# Patient Record
Sex: Female | Born: 1950
Health system: Southern US, Community
[De-identification: ages and names within clinical notes are randomized; demographics above are authoritative.]

## PROBLEM LIST (undated history)

## (undated) DIAGNOSIS — M1990 Unspecified osteoarthritis, unspecified site: Secondary | ICD-10-CM

## (undated) DIAGNOSIS — T7840XA Allergy, unspecified, initial encounter: Secondary | ICD-10-CM

## (undated) DIAGNOSIS — L309 Dermatitis, unspecified: Secondary | ICD-10-CM

## (undated) DIAGNOSIS — K219 Gastro-esophageal reflux disease without esophagitis: Secondary | ICD-10-CM

## (undated) DIAGNOSIS — N390 Urinary tract infection, site not specified: Secondary | ICD-10-CM

## (undated) DIAGNOSIS — K5792 Diverticulitis of intestine, part unspecified, without perforation or abscess without bleeding: Secondary | ICD-10-CM

## (undated) DIAGNOSIS — N83209 Unspecified ovarian cyst, unspecified side: Secondary | ICD-10-CM

## (undated) DIAGNOSIS — L409 Psoriasis, unspecified: Secondary | ICD-10-CM

## (undated) DIAGNOSIS — M5104 Intervertebral disc disorders with myelopathy, thoracic region: Secondary | ICD-10-CM

## (undated) HISTORY — PX: OOPHORECTOMY: SHX86

## (undated) HISTORY — PX: COLONOSCOPY: SHX174

## (undated) HISTORY — DX: Allergy, unspecified, initial encounter: T78.40XA

## (undated) HISTORY — PX: REFRACTIVE SURGERY: SHX103

## (undated) HISTORY — PX: TUBAL LIGATION: SHX77

## (undated) HISTORY — DX: Diverticulitis of intestine, part unspecified, without perforation or abscess without bleeding: K57.92

## (undated) HISTORY — DX: Psoriasis, unspecified: L40.9

## (undated) HISTORY — DX: Unspecified ovarian cyst, unspecified side: N83.209

## (undated) HISTORY — DX: Dermatitis, unspecified: L30.9

## (undated) HISTORY — PX: EYE SURGERY: SHX253

---

## 2003-05-19 ENCOUNTER — Other Ambulatory Visit: Admission: RE | Admit: 2003-05-19 | Discharge: 2003-05-19 | Payer: Self-pay | Admitting: Family Medicine

## 2004-06-06 ENCOUNTER — Encounter: Payer: Self-pay | Admitting: Family Medicine

## 2004-06-06 ENCOUNTER — Other Ambulatory Visit: Admission: RE | Admit: 2004-06-06 | Discharge: 2004-06-06 | Payer: Self-pay | Admitting: Family Medicine

## 2004-06-06 LAB — CONVERTED CEMR LAB: Pap Smear: NORMAL

## 2004-08-31 ENCOUNTER — Ambulatory Visit: Payer: Self-pay | Admitting: Family Medicine

## 2004-11-06 ENCOUNTER — Ambulatory Visit: Payer: Self-pay | Admitting: Family Medicine

## 2005-05-01 ENCOUNTER — Ambulatory Visit: Payer: Self-pay | Admitting: Family Medicine

## 2005-05-17 ENCOUNTER — Ambulatory Visit: Payer: Self-pay | Admitting: Family Medicine

## 2005-08-17 ENCOUNTER — Ambulatory Visit: Payer: Self-pay | Admitting: Family Medicine

## 2005-10-03 ENCOUNTER — Ambulatory Visit: Payer: Self-pay | Admitting: Family Medicine

## 2005-10-30 ENCOUNTER — Ambulatory Visit: Payer: Self-pay | Admitting: Unknown Physician Specialty

## 2006-03-06 ENCOUNTER — Ambulatory Visit: Payer: Self-pay | Admitting: Family Medicine

## 2006-06-26 ENCOUNTER — Ambulatory Visit: Payer: Self-pay | Admitting: Family Medicine

## 2007-01-18 ENCOUNTER — Emergency Department (HOSPITAL_COMMUNITY): Admission: EM | Admit: 2007-01-18 | Discharge: 2007-01-18 | Payer: Self-pay | Admitting: Family Medicine

## 2007-04-01 ENCOUNTER — Ambulatory Visit: Payer: Self-pay | Admitting: Unknown Physician Specialty

## 2007-06-18 ENCOUNTER — Encounter: Payer: Self-pay | Admitting: Family Medicine

## 2007-06-18 DIAGNOSIS — J309 Allergic rhinitis, unspecified: Secondary | ICD-10-CM | POA: Insufficient documentation

## 2007-06-18 DIAGNOSIS — Z8719 Personal history of other diseases of the digestive system: Secondary | ICD-10-CM | POA: Insufficient documentation

## 2007-06-18 DIAGNOSIS — N83209 Unspecified ovarian cyst, unspecified side: Secondary | ICD-10-CM | POA: Insufficient documentation

## 2007-07-10 ENCOUNTER — Ambulatory Visit: Payer: Self-pay | Admitting: Family Medicine

## 2007-07-11 LAB — CONVERTED CEMR LAB
ALT: 25 units/L (ref 0–35)
Basophils Relative: 0.1 % (ref 0.0–1.0)
Bilirubin, Direct: 0.1 mg/dL (ref 0.0–0.3)
CO2: 29 meq/L (ref 19–32)
Direct LDL: 159.7 mg/dL
Eosinophils Absolute: 0.3 10*3/uL (ref 0.0–0.6)
Eosinophils Relative: 5 % (ref 0.0–5.0)
GFR calc Af Amer: 96 mL/min
GFR calc non Af Amer: 79 mL/min
Glucose, Bld: 93 mg/dL (ref 70–99)
HDL: 41.2 mg/dL (ref >39.0)
Hemoglobin: 13.2 g/dL (ref 12.0–15.0)
Lymphocytes Relative: 39.2 % (ref 12.0–46.0)
MCV: 91.6 fL (ref 78.0–100.0)
Monocytes Absolute: 0.6 10*3/uL (ref 0.2–0.7)
Neutro Abs: 2.6 10*3/uL (ref 1.4–7.7)
Neutrophils Relative %: 44.8 % (ref 43.0–77.0)
Platelets: 306 10*3/uL (ref 150–400)
Potassium: 4.1 meq/L (ref 3.5–5.1)
Sodium: 140 meq/L (ref 135–145)
Total Protein: 6.8 g/dL (ref 6.0–8.3)
VLDL: 16 mg/dL (ref 0–40)
WBC: 5.7 10*3/uL (ref 4.5–10.5)

## 2007-07-31 ENCOUNTER — Ambulatory Visit: Payer: Self-pay | Admitting: Family Medicine

## 2007-08-01 ENCOUNTER — Encounter (INDEPENDENT_AMBULATORY_CARE_PROVIDER_SITE_OTHER): Payer: Self-pay | Admitting: *Deleted

## 2007-08-01 LAB — FECAL OCCULT BLOOD, GUAIAC: Fecal Occult Blood: NEGATIVE

## 2007-11-06 ENCOUNTER — Telehealth: Payer: Self-pay | Admitting: Family Medicine

## 2008-01-07 ENCOUNTER — Ambulatory Visit: Payer: Self-pay | Admitting: Family Medicine

## 2008-01-07 DIAGNOSIS — M549 Dorsalgia, unspecified: Secondary | ICD-10-CM | POA: Insufficient documentation

## 2008-01-07 DIAGNOSIS — M722 Plantar fascial fibromatosis: Secondary | ICD-10-CM | POA: Insufficient documentation

## 2008-02-03 ENCOUNTER — Encounter: Payer: Self-pay | Admitting: Family Medicine

## 2008-03-04 ENCOUNTER — Encounter: Payer: Self-pay | Admitting: Family Medicine

## 2008-04-01 ENCOUNTER — Encounter: Payer: Self-pay | Admitting: Family Medicine

## 2008-04-20 ENCOUNTER — Encounter (INDEPENDENT_AMBULATORY_CARE_PROVIDER_SITE_OTHER): Payer: Self-pay | Admitting: Internal Medicine

## 2008-04-20 ENCOUNTER — Ambulatory Visit: Payer: Self-pay | Admitting: Family Medicine

## 2008-06-16 ENCOUNTER — Ambulatory Visit: Payer: Self-pay | Admitting: Unknown Physician Specialty

## 2008-07-22 ENCOUNTER — Ambulatory Visit: Payer: Self-pay | Admitting: Family Medicine

## 2008-07-22 ENCOUNTER — Encounter (INDEPENDENT_AMBULATORY_CARE_PROVIDER_SITE_OTHER): Payer: Self-pay | Admitting: Internal Medicine

## 2008-07-22 DIAGNOSIS — N390 Urinary tract infection, site not specified: Secondary | ICD-10-CM | POA: Insufficient documentation

## 2008-07-22 LAB — CONVERTED CEMR LAB
Bilirubin Urine: NEGATIVE
Glucose, Urine, Semiquant: NEGATIVE
Glucose, Urine, Semiquant: NEGATIVE
Nitrite: NEGATIVE
Nitrite: NEGATIVE
Protein, U semiquant: NEGATIVE
Protein, U semiquant: NEGATIVE
RBC / HPF: 0
Specific Gravity, Urine: <1.005
Urobilinogen, UA: 0.2
Urobilinogen, UA: NEGATIVE
pH: 7

## 2008-07-30 ENCOUNTER — Telehealth (INDEPENDENT_AMBULATORY_CARE_PROVIDER_SITE_OTHER): Payer: Self-pay | Admitting: Internal Medicine

## 2008-12-03 ENCOUNTER — Telehealth (INDEPENDENT_AMBULATORY_CARE_PROVIDER_SITE_OTHER): Payer: Self-pay | Admitting: Internal Medicine

## 2008-12-03 ENCOUNTER — Encounter: Admission: RE | Admit: 2008-12-03 | Discharge: 2008-12-03 | Payer: Self-pay | Admitting: Family Medicine

## 2008-12-03 ENCOUNTER — Ambulatory Visit: Payer: Self-pay | Admitting: Family Medicine

## 2008-12-03 DIAGNOSIS — R1011 Right upper quadrant pain: Secondary | ICD-10-CM | POA: Insufficient documentation

## 2008-12-06 ENCOUNTER — Ambulatory Visit: Payer: Self-pay | Admitting: Family Medicine

## 2008-12-07 ENCOUNTER — Ambulatory Visit: Payer: Self-pay | Admitting: Cardiology

## 2008-12-08 LAB — CONVERTED CEMR LAB
BUN: 10 mg/dL (ref 6–23)
Calcium: 9.2 mg/dL (ref 8.4–10.5)
Creatinine, Ser: 0.7 mg/dL (ref 0.4–1.2)
GFR calc Af Amer: 112 mL/min
GFR calc non Af Amer: 92 mL/min

## 2008-12-09 ENCOUNTER — Encounter (INDEPENDENT_AMBULATORY_CARE_PROVIDER_SITE_OTHER): Payer: Self-pay | Admitting: Internal Medicine

## 2009-10-21 ENCOUNTER — Ambulatory Visit: Payer: Self-pay | Admitting: Family Medicine

## 2009-10-21 DIAGNOSIS — B009 Herpesviral infection, unspecified: Secondary | ICD-10-CM | POA: Insufficient documentation

## 2009-12-14 ENCOUNTER — Ambulatory Visit: Payer: Self-pay | Admitting: Unknown Physician Specialty

## 2010-04-06 ENCOUNTER — Encounter: Payer: Self-pay | Admitting: Family Medicine

## 2010-05-08 ENCOUNTER — Telehealth: Payer: Self-pay | Admitting: Internal Medicine

## 2010-08-31 ENCOUNTER — Ambulatory Visit: Payer: Self-pay | Admitting: Internal Medicine

## 2010-08-31 DIAGNOSIS — J0120 Acute ethmoidal sinusitis, unspecified: Secondary | ICD-10-CM | POA: Insufficient documentation

## 2010-08-31 DIAGNOSIS — R350 Frequency of micturition: Secondary | ICD-10-CM | POA: Insufficient documentation

## 2010-08-31 LAB — CONVERTED CEMR LAB
Bacteria, UA: 0
Casts: 0 /lpf
Nitrite: NEGATIVE
Specific Gravity, Urine: 1.01
Urine crystals, microscopic: 0 /hpf
Yeast, UA: 0

## 2010-11-23 ENCOUNTER — Telehealth: Payer: Self-pay | Admitting: Family Medicine

## 2010-12-06 ENCOUNTER — Telehealth: Payer: Self-pay | Admitting: Family Medicine

## 2010-12-10 ENCOUNTER — Encounter: Payer: Self-pay | Admitting: Family Medicine

## 2010-12-19 NOTE — Progress Notes (Signed)
Summary: Anucort HC  Phone Note Refill Request Message from:  Fax from Pharmacy on May 08, 2010 11:54 AM  Refills Requested: Medication #1:  ANUSOL-HC 25 MG SUPP 1 suppository per rectum up to three times a day as needed hemorroid  do not use for more than 3 days in a row. [place]  Phone:   [phone]   Method Requested: Electronic Initial call taken by: Delilah Shan CMA Duncan Dull),  May 08, 2010 11:54 AM  Follow-up for Phone Call        okay to fill #24 x 0 Follow-up by: Cindee Salt MD,  May 08, 2010 1:17 PM  Additional Follow-up for Phone Call Additional follow up Details #1::        Rx faxed to pharmacy Additional Follow-up by: DeShannon Smith CMA Duncan Dull),  May 08, 2010 3:15 PM    Prescriptions: ANUSOL-HC 25 MG SUPP (HYDROCORTISONE ACETATE) 1 suppository per rectum up to three times a day as needed hemorroid  do not use for more than 3 days in a row  #24 x 0   Entered by:   Mervin Hack CMA (AAMA)   Authorized by:   Cindee Salt MD   Signed by:   Mervin Hack CMA (AAMA) on 05/08/2010   Method used:   Electronically to        [facility]* (retail)       [address]       [place], [place]  [address]       Ph: [phone]       Fax: [phone]   RxID:   [id number]

## 2010-12-19 NOTE — Letter (Signed)
Summary: [facility]  [facility]   Imported By: Lanelle Bal 05/19/2010 12:33:37  _____________________________________________________________________  External Attachment:    Type:   Image     Comment:   External Document

## 2010-12-19 NOTE — Assessment & Plan Note (Signed)
Summary: ? VIRAL SYMPTOMS   Vital Signs:  Patient profile:   60 year old female Height:      63.5 inches Weight:      174.25 pounds BMI:     30.49 Temp:     99.6 degrees F oral Pulse rate:   84 / minute Pulse rhythm:   regular BP sitting:   146 / 82  (left arm) Cuff size:   large  Vitals Entered By: Selena Batten Dance CMA Duncan Dull) (August 31, 2010 3:33 PM) CC: Sick x4 weeks   History of Present Illness: CC: sick x 4 wks?  Feeling feverish x 4 wks.  Last night got real bad.  Chills and fever x 3 hours.  Also more congested today.  + thick yellow mucous from nose.  Congested in chest.  Started upper respiratory.  Got better then worse.  Today sore throat.  ? dysuria, + urgency and frequency since yesterday.  progressively worse.  + sinus congestion.  worse with bending head forward.  Tried mucinex DM didn't help.  takes daily allergy pill.  No rashes, abd pain, n/v/d.  no coughing.  No ear pain or tooth pain.  No HA  husband with bacterial infection on cipro a while back.  no smokers at home.  Current Medications (verified): 1)  Calcium 600 600 Mg  Tabs (Calcium Carbonate) .... Take By Mouth As Directed 2)  Vitamin D 400 Unit  Tabs (Cholecalciferol) .... Take By Mouth As Directed 3)  Glucosamine Chondroitin Complx   Tabs (Glucosamine-Chondroit-Vit C-Mn) .... Take By Mouth As Directed 4)  Valtrex 500 Mg  Tabs (Valacyclovir Hcl) .... Take 2 Tabs By Mouth At Onset of Cold Sore Then 2 Tabs 12 Hours Later 5)  Daily Vitamin Formula   Tabs (Multiple Vitamin) .... Take 1 Tablet By Mouth Once A Day 6)  Folic Acid 400 Mcg Tabs (Folic Acid) .... As Needed Over The Counter 7)  Align  Caps (Misc Intestinal Flora Regulat) .... As Needed Over The Counter 8)  Anusol-Hc 25 Mg Supp (Hydrocortisone Acetate) .Marland Kitchen.. 1 Suppository Per Rectum Up To Three Times A Day As Needed Hemorroid  Do Not Use For More Than 3 Days in A Row 9)  Premarin 0.625 Mg/gm Crea (Estrogens, Conjugated) .... As Directed  Allergies: 1)   Tetracycline  Past History:  Past Medical History: Last updated: 06/18/2007 Allergic rhinitis Diverticulitis, hx of  Social History: Last updated: 06/18/2007 Marital Status: Married Children: 3 Occupation: [employer]  Review of Systems       per HPI  Physical Exam  General:  overweight, tired appearing Head:  normocephalic, atraumatic, and no abnormalities observed.  + ethmoid sinus tenderness on right Eyes:  No corneal or conjunctival inflammation noted. EOMI. Perrla.  Ears:  External ear exam shows no significant lesions or deformities.  Otoscopic examination reveals clear canals, tympanic membranes are intact bilaterally without bulging, retraction, inflammation or discharge. Hearing is grossly normal bilaterally. Nose:  External nasal examination shows no deformity or inflammation. Nasal mucosa are pink and moist without lesions or exudates. Mouth:  mild pharyngeal erythema Neck:  no LAD, + tender R throat to palpation Lungs:  Normal respiratory effort, chest expands symmetrically. Lungs are clear to auscultation, no crackles or wheezes. Heart:  normal rate, regular rhythm, and no murmur.   Abdomen:  suprapubic pressure. Pulses:  2+ rad pulses Extremities:  no edema Skin:  no rash   Impression & Recommendations:  Problem # 1:  ACUTE ETHMOIDAL SINUSITIS (ICD-461.2) Initially wanted  to treat with amoxicillin, pt says amoxicillin does not help her and augmentin causes stomach upset.  requests cipro.  filled x 10 day course.  Call if symptoms persist or worsen.   sent in flonase as well.  The following medications were removed from the medication list:    Cipro 500 Mg Tabs (Ciprofloxacin hcl) .Marland Kitchen... 1 by mouth two times a day for 10 days    Flagyl 500 Mg Tabs (Metronidazole) .Marland Kitchen... 1 by mouth three times a day for 10 days Her updated medication list for this problem includes:    Ciprofloxacin Hcl 500 Mg Tabs (Ciprofloxacin hcl) .Marland Kitchen... Take one by mouth two times a day x 10  days    Flonase 50 Mcg/act Susp (Fluticasone propionate) .Marland Kitchen... 2 squirts in each nostril two times a day  Problem # 2:  URINARY FREQUENCY (ICD-788.41) UA micro not impressive for infection.  push fluids, return if not improved. The following medications were removed from the medication list:    Pyridium 100 Mg Tabs (Phenazopyridine hcl) .Marland Kitchen... 1-2 by mouth up to three times a day as needed urinary pain  Orders: UA Dipstick W/ Micro (manual) (21308)  Complete Medication List: 1)  Calcium 600 600 Mg Tabs (Calcium carbonate) .... Take by mouth as directed 2)  Vitamin D 400 Unit Tabs (Cholecalciferol) .... Take by mouth as directed 3)  Glucosamine Chondroitin Complx Tabs (Glucosamine-chondroit-vit c-mn) .... Take by mouth as directed 4)  Valtrex 500 Mg Tabs (Valacyclovir hcl) .... Take 2 tabs by mouth at onset of cold sore then 2 tabs 12 hours later 5)  Daily Vitamin Formula Tabs (Multiple vitamin) .... Take 1 tablet by mouth once a day 6)  Folic Acid 400 Mcg Tabs (Folic acid) .... As needed over the counter 7)  Align Caps (Misc intestinal flora regulat) .... As needed over the counter 8)  Anusol-hc 25 Mg Supp (Hydrocortisone acetate) .Marland Kitchen.. 1 suppository per rectum up to three times a day as needed hemorroid  do not use for more than 3 days in a row 9)  Premarin 0.625 Mg/gm Crea (Estrogens, conjugated) .... As directed 10)  Ciprofloxacin Hcl 500 Mg Tabs (Ciprofloxacin hcl) .... Take one by mouth two times a day x 10 days 11)  Flonase 50 Mcg/act Susp (Fluticasone propionate) .... 2 squirts in each nostril two times a day  Patient Instructions: 1)  For sinuses, start flonase - nasal steroid, and nasal saline spray over the counter.  Drink plenty of fluids to help get mucous out. 2)  Antibiotic twice daily for 10 days.  Return if fevers continue, or if trouble breathing, swallowing, or other concern. 3)  urine doesn't look like infection.  push fluids, if not improving, return to be seen. 4)  Call  clinic with questions or if not improving as expected Prescriptions: CIPROFLOXACIN HCL 500 MG TABS (CIPROFLOXACIN HCL) take one by mouth two times a day x 10 days  #20 x 0   Entered and Authorized by:   Eustaquio Boyden  MD   Signed by:   Eustaquio Boyden  MD on 08/31/2010   Method used:   Electronically to        [facility]* (retail)       [address]       [place], [place]  [address]       Ph: [phone]       Fax: [phone]   RxID:   [id number] FLONASE 50 MCG/ACT SUSP (FLUTICASONE PROPIONATE) 2 squirts in each nostril two times  a day  #1 x 3   Entered and Authorized by:   Eustaquio Boyden  MD   Signed by:   Eustaquio Boyden  MD on 08/31/2010   Method used:   Electronically to        [facility]* (retail)       [address]       [place], [place]  [address]       Ph: [phone]       Fax: [phone]   RxID:   [id number] AMOXICILLIN 875 MG TABS (AMOXICILLIN) take one by mouth two times a day x 10 days  #20 x 0   Entered and Authorized by:   Eustaquio Boyden  MD   Signed by:   Eustaquio Boyden  MD on 08/31/2010   Method used:   Electronically to        [facility]* (retail)       [address]       [place], [place]  [address]       Ph: [phone]       Fax: [phone]   RxID:   [id number]   Current Allergies (reviewed today): TETRACYCLINE  Laboratory Results   Urine Tests  Date/Time Received: August 31, 2010 4:02 PM  Date/Time Reported: August 31, 2010 4:02 PM   Routine Urinalysis   Color: straw Appearance: Clear Glucose: negative   (Normal Range: Negative) Bilirubin: negative   (Normal Range: Negative) Ketone: negative   (Normal Range: Negative) Spec. Gravity: 1.010   (Normal Range: 1.003-1.035) Blood: small   (Normal Range: Negative) pH: 5.0   (Normal Range: 5.0-8.0) Protein: negative   (Normal Range: Negative) Urobilinogen: 0.2   (Normal Range: 0-1) Nitrite: negative   (Normal Range: Negative) Leukocyte Esterace: small    (Normal Range: Negative)  Urine Microscopic WBC/HPF: rare RBC/HPF: 0-3 Bacteria/HPF: 0 Mucous/HPF: 0 Epithelial/HPF: rare Crystals/HPF: 0 Casts/LPF: 0 Yeast/HPF: 0    Comments: read by ...............Eustaquio Boyden  MD  August 31, 2010 4:15 PM

## 2010-12-21 NOTE — Progress Notes (Signed)
Summary: patient going to [facility]  Phone Note Call from Patient Call back at Home Phone [phone]   Caller: Patient Call For: Judith Part MD Summary of Call: Patient walked in this morning wanting appt., she says that she has a uti, and has notice blood in her urine this morning. There are no appt available until 3:30 today and she didn't want to wait until then. I suggested the [facility] on [address]. Patient was agreed to that.  Initial call taken by: Melody Comas,  December 06, 2010 8:35 AM  Follow-up for Phone Call        agree with above  Follow-up by: Judith Part MD,  December 06, 2010 10:13 AM

## 2010-12-21 NOTE — Progress Notes (Signed)
Summary: anusol   Phone Note Refill Request Message from:  Fax from Pharmacy on November 23, 2010 4:18 PM  Refills Requested: Medication #1:  ANUSOL-HC 25 MG SUPP 1 suppository per rectum up to three times a day as needed hemorroid  do not use for more than 3 days in a row   Last Refilled: 05/08/2010 Refill request from [id number].   Initial call taken by: Melody Comas,  November 23, 2010 4:19 PM  Follow-up for Phone Call        px written on EMR for call in  Follow-up by: Judith Part MD,  November 23, 2010 4:46 PM  Additional Follow-up for Phone Call Additional follow up Details #1::        med sent electronically to [facility] pharmacy.Lewanda Rife LPN  November 23, 2010 5:06 PM     Prescriptions: ANUSOL-HC 25 MG SUPP (HYDROCORTISONE ACETATE) 1 suppository per rectum up to three times a day as needed hemorroid  do not use for more than 3 days in a row  #24 x 0   Entered by:   Lewanda Rife LPN   Authorized by:   Judith Part MD   Signed by:   Lewanda Rife LPN on 45/40/9811   Method used:   Electronically to        [facility]* (retail)       [address]       [place], [place]  [address]       Ph: [phone]       Fax: [phone]   RxID:   [id number]

## 2011-03-01 ENCOUNTER — Other Ambulatory Visit: Payer: Self-pay | Admitting: *Deleted

## 2011-03-02 MED ORDER — HYDROCORTISONE ACETATE 25 MG RE SUPP: RECTAL | Status: DC

## 2011-03-02 MED ORDER — VALACYCLOVIR HCL 500 MG PO TABS: ORAL_TABLET | ORAL | Status: DC

## 2011-03-02 NOTE — Telephone Encounter (Signed)
Medication phoned to  [facility] pharmacy as instructed. Patient notified as instructed by telephone.

## 2011-03-02 NOTE — Telephone Encounter (Signed)
Px written for call in   

## 2011-04-05 ENCOUNTER — Ambulatory Visit: Payer: Self-pay | Admitting: Unknown Physician Specialty

## 2011-10-12 ENCOUNTER — Other Ambulatory Visit: Payer: Self-pay | Admitting: *Deleted

## 2011-10-12 MED ORDER — HYDROCORTISONE ACETATE 25 MG RE SUPP: RECTAL | Status: DC

## 2011-10-12 NOTE — Telephone Encounter (Signed)
Will refill electronically  

## 2011-10-12 NOTE — Telephone Encounter (Signed)
Faxed request from [facility], last filled 03/02/11.

## 2011-11-13 ENCOUNTER — Emergency Department: Payer: Self-pay | Admitting: Emergency Medicine

## 2011-11-14 ENCOUNTER — Telehealth: Payer: Self-pay | Admitting: Family Medicine

## 2011-11-14 NOTE — Telephone Encounter (Signed)
Triage Record Num: [id number] Operator: Caswell Corwin Patient Name: Katelyn Rivera Call Date & Time: 11/12/2011 8:53:49PM Patient Phone: [phone] PCP: Audrie Gallus. Tower Patient Gender: Female PCP Fax : Patient DOB: [birth year] Practice Name: [facility] Reason for Call: Caller: Olena/Patient; PCP: Roxy Manns A.; CB#: [phone]; Call regarding Diverticulitis that started today. Has pain when she moves. She thinks it is stress induced. Triaged Abd Painand pt took Tramadol, but it has not helped. Triaged Abd Pain and pt cannot eat, or do her usual activities. Inst needs to go to the E/R for eval. Wants Dr. called for Cipro and Flagyl anbx. Paged Dr. Kerin Perna and inst pt will have to be evaluated. Pt recalled and inst given. Protocol(s) Used: Abdominal Pain Recommended Outcome per Protocol: See ED Immediately Reason for Outcome: Generalized abdominal pain that progresses to localized pain AND any of the following: loss of appetite, vomiting starting after pain, any fever, OR unable to carry out normal activities Care Advice: ~ 12/

## 2011-11-16 ENCOUNTER — Telehealth: Payer: Self-pay | Admitting: Internal Medicine

## 2011-11-16 ENCOUNTER — Telehealth: Payer: Self-pay | Admitting: Family Medicine

## 2011-11-16 NOTE — Telephone Encounter (Signed)
error 

## 2011-11-16 NOTE — Telephone Encounter (Signed)
Pt. called our call service on Dec 24 after 6pm and they advised her per their protocol to see ER and they also called Dr. Patsy Lager for advise on meds because she wanted medications instead of going to ER. Dr. Patsy Lager advised same and patient ended up going to ER and is now upset because she has an ER visit bill and was not prescribed meds from calling call service. I checked with Dr. Milinda Antis and she said she would have advised the same given the situation and we do not prescribe antibiotics over the phone and someone needed to evaluate the patient which is what was advised when patient called in.  Patient stated she would not return to our office unless we paid her ER bill. Dr. Milinda Antis advised that I speak with risk management prior to calling patient back for advise. I called patient back today to advise we will need until Wed to call back but that we are working on it.  She verbalized understanding and was appreciative of call back.

## 2011-11-19 NOTE — Telephone Encounter (Signed)
Patients with acute abdominal pain need face to face evaluation. It is against Optician, dispensing policy to prescribe antibiotics on the phone, and it is inappropriate to evaluate abdominal pain over the phone and potentially dangerous.

## 2011-11-19 NOTE — Telephone Encounter (Signed)
Agree with above 

## 2011-11-21 ENCOUNTER — Telehealth: Payer: Self-pay | Admitting: Family Medicine

## 2011-11-21 NOTE — Telephone Encounter (Signed)
aware

## 2011-11-21 NOTE — Telephone Encounter (Signed)
Called patient and explained information provided from Risk Management regarding review of concerns and that the care delivered was based on sound and responsible medical knowledge.  Also explained that we were sorry that she is unhappy and apologized for any inconvenience, explained our physicians will continue to make medical decisions that are based on patient safety rather than patient convenience and if she chooses to leave the practice due to that, then that is her choice.  Patient stated that she felt it was "uncalled for" and that she didn't agree and that "she knew what she needed to do".  She said that her co-pay is $5000 and that she felt we should have gave her a prescription until she could have gotten into the office.  I again explained that the decision was made based off medical knowledge and her safety.  I further explained if there was anything else we could do to assist with or if she needed additional appointments to please let us know and we would be glad to assist.  The patient hung up the phone on me with no further communication.

## 2011-11-23 ENCOUNTER — Other Ambulatory Visit: Payer: Self-pay | Admitting: Family Medicine

## 2011-11-23 MED ORDER — CIPROFLOXACIN HCL 500 MG PO TABS: 500.0000 mg | ORAL_TABLET | Freq: Two times a day (BID) | ORAL | Status: DC

## 2011-11-23 MED ORDER — METRONIDAZOLE 500 MG PO TABS: 500.0000 mg | ORAL_TABLET | Freq: Three times a day (TID) | ORAL | Status: DC

## 2011-11-23 NOTE — Telephone Encounter (Signed)
Pt said was seen [facility] ER on 11/13/11 with diverticulitis and she finished Cipro 500mg  taking 1 twice a day and Flagyl 500 mg taking 1 three times a day today. Pt said she still has dull pain in lower left abdomen , no diarrhea, no fever but she knows she needs a few more days of antibiotics. Pt stated she called [facility] ER and was told to call her PCP. 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 Pt is still not happy she had to go to ER (see 11/21/11 note). Pt wants med called to [facility] and wants call back today at [phone].Please advise.

## 2011-11-23 NOTE — Telephone Encounter (Signed)
If she has choosen to stay with our practice then ok to refil her flagyl and cipro over the weekend until I can see her Monday  (since she has already been seen and evaluated) If worse over the weekend needs to call the nurse line or seek care in the ER If she directs angry attitude towards me at follow up I will be forced to discharge her from the practice (that statement is just an fyi)

## 2011-11-23 NOTE — Telephone Encounter (Signed)
Patient notified as instructed by telephone. Medication sent electronically to [facility] pharmacy as instructed.Pt scheduled appt with Dr Milinda Antis 11/26/11.

## 2011-11-26 ENCOUNTER — Encounter: Payer: Self-pay | Admitting: Family Medicine

## 2011-11-26 ENCOUNTER — Ambulatory Visit (INDEPENDENT_AMBULATORY_CARE_PROVIDER_SITE_OTHER): Payer: BC Managed Care – PPO | Admitting: Family Medicine

## 2011-11-26 VITALS — BP 140/78 | HR 60 | Temp 98.5°F | Ht 63.5 in | Wt 172.8 lb

## 2011-11-26 DIAGNOSIS — Z8719 Personal history of other diseases of the digestive system: Secondary | ICD-10-CM

## 2011-11-26 DIAGNOSIS — F430 Acute stress reaction: Secondary | ICD-10-CM | POA: Insufficient documentation

## 2011-11-26 NOTE — Assessment & Plan Note (Signed)
Reviewed stressors and symptoms from her jobs currently and lack of good insurance Disc coping mech/ symptoms and tx opt  Offered counseling at any time if she needs it  Stressed imp of self care and illness prevention

## 2011-11-26 NOTE — Patient Instructions (Signed)
I'm glad you are feeling better  If pain returns let me know  If any fever- please alert me also  We will do ref to GI at check out

## 2011-11-26 NOTE — Progress Notes (Signed)
Subjective:    Patient ID: Katelyn Rivera, female    DOB: [birth year], 61 y.o.   MRN: [id number]  HPI Here for f/u of diverticulitis episode  Seen on 12/25 at [facility]  Put on flagyl and cipro -- and wbc was not elevated   Had to extend her abx over the weekend - was not quite pain free  Pain is much better today  No fever  No trouble from the antibiotics   Was very upset when Dr Patsy Lager declined abx over the phone - and this was well explained to her  Has not had attack in a while   Last colonosc -- was 2000  Declined one with Dr Marva Panda -- in 2009  Wants to get set up for a visit   Is very very stressed -- working 4 part time jobs  Made a mistake at work with direct deposit  Louann Sjogren is really bad to her  Thinks stress played a role in her flare  She is generally exhausted  Patient Active Problem List  Diagnoses  . COLD SORE  . ACUTE ETHMOIDAL SINUSITIS  . ALLERGIC RHINITIS  . UTI  . OVARIAN CYST  . BACK PAIN  . PLANTAR FASCIITIS  . URINARY FREQUENCY  . RUQ PAIN  . DIVERTICULITIS, HX OF   Past Medical History  Diagnosis Date  . Allergy     allergic rhinitis  . Diverticulitis     Hx of   Past Surgical History  Procedure Date  . Tubal ligation   . Refractive surgery    History  Substance Use Topics  . Smoking status: Never Smoker   . Smokeless tobacco: Not on file  . Alcohol Use: Not on file   Family History  Problem Relation Age of Onset  . Diverticulitis Mother   . Cancer Father     brain tumor  . Diverticulitis Sister    Allergies  Allergen Reactions  . Tetracycline     REACTION: rash   Current Outpatient Prescriptions on File Prior to Visit  Medication Sig Dispense Refill  . ciprofloxacin (CIPRO) 500 MG tablet Take 1 tablet (500 mg total) by mouth 2 (two) times daily.  6 tablet  0  . metroNIDAZOLE (FLAGYL) 500 MG tablet Take 1 tablet (500 mg total) by mouth 3 (three) times daily.  9 tablet  0  . hydrocortisone (ANUSOL-HC) 25 MG suppository Unwrap  and insert 1 suppository per rectum up to 3 times a day as needed for hemorrhoid . Do not used for more than 3 days in a row.  12 suppository  0  . valACYclovir (VALTREX) 500 MG tablet Take 2 tablets by mouth at onset of cold sore then 2 tablets 12 hrs later.  8 tablet  2       Review of Systems Review of Systems  Constitutional: Negative for fever, appetite change,  and unexpected weight change.  Eyes: Negative for pain and visual disturbance.  Respiratory: Negative for cough and shortness of breath.   Cardiovascular: Negative for cp or palpitations    Gastrointestinal: Negative for nausea, diarrhea and constipation.neg for blood in stool or dark stool , currently neg for any abdominal pain   Genitourinary: Negative for urgency and frequency.  Skin: Negative for pallor or rash   Neurological: Negative for weakness, light-headedness, numbness and headaches.  Hematological: Negative for adenopathy. Does not bruise/bleed easily.  Psychiatric/Behavioral: Negative for dysphoric mood. The patient is not nervous/anxious. Pos for generalized stress and fatigue from that  Objective:   Physical Exam  Constitutional: She appears well-developed and well-nourished.       overwt and well appearing   HENT:  Head: Normocephalic and atraumatic.  Mouth/Throat: Oropharynx is clear and moist.  Eyes: Conjunctivae and EOM are normal. Pupils are equal, round, and reactive to light. No scleral icterus.  Neck: Normal range of motion. Neck supple. No JVD present.  Cardiovascular: Normal rate, regular rhythm, normal heart sounds and intact distal pulses.  Exam reveals no gallop.   Pulmonary/Chest: Effort normal and breath sounds normal. No respiratory distress. She has no wheezes.  Abdominal: Soft. Bowel sounds are normal. She exhibits no distension and no mass. There is no tenderness. There is no rebound and no guarding.       No pain upon shaking the table    Musculoskeletal: Normal range of  motion. She exhibits no edema.  Lymphadenopathy:    She has no cervical adenopathy.  Neurological: She is alert. She has normal reflexes. She displays no tremor.  Skin: Skin is warm and dry. No rash noted. No erythema. No pallor.  Psychiatric: She has a normal mood and affect.          Assessment & Plan:

## 2011-11-26 NOTE — Assessment & Plan Note (Addendum)
Now finishing 12 day course of cipro and flagyl and symptom free reasssuring exam  Ref to GI- due for colonosc Disc high fiber diet and avoid nuts/seeds Reviewed records from [facility] in detail incl labs  Did explain well why we do not px abx over the phone and not to expect it

## 2011-11-30 NOTE — Telephone Encounter (Signed)
Called patient on Jan 2nd and explained information as described by physicians and per risk management that the patient was advised and instructed based on medical knowledge and apologized for any inconvenience.

## 2012-04-11 ENCOUNTER — Other Ambulatory Visit: Payer: Self-pay | Admitting: *Deleted

## 2012-04-11 ENCOUNTER — Encounter: Payer: Self-pay | Admitting: Family Medicine

## 2012-04-11 MED ORDER — HYDROCORTISONE ACETATE 25 MG RE SUPP: RECTAL | Status: DC

## 2012-04-11 NOTE — Telephone Encounter (Signed)
Faxed refill request from [facility], last filled 10/12/11 for # 12.

## 2012-04-11 NOTE — Telephone Encounter (Signed)
Will refill electronically  

## 2015-03-08 DIAGNOSIS — D239 Other benign neoplasm of skin, unspecified: Secondary | ICD-10-CM

## 2015-03-08 HISTORY — DX: Other benign neoplasm of skin, unspecified: D23.9

## 2015-12-06 ENCOUNTER — Emergency Department
Admission: EM | Admit: 2015-12-06 | Discharge: 2015-12-07 | Disposition: A | Discharge status: Home / Self Care | Payer: Self-pay | Attending: Emergency Medicine | Admitting: Emergency Medicine

## 2015-12-06 ENCOUNTER — Encounter: Payer: Self-pay | Admitting: Emergency Medicine

## 2015-12-06 DIAGNOSIS — R200 Anesthesia of skin: Secondary | ICD-10-CM

## 2015-12-06 DIAGNOSIS — Z7951 Long term (current) use of inhaled steroids: Secondary | ICD-10-CM | POA: Insufficient documentation

## 2015-12-06 DIAGNOSIS — Z79899 Other long term (current) drug therapy: Secondary | ICD-10-CM | POA: Insufficient documentation

## 2015-12-06 DIAGNOSIS — M5126 Other intervertebral disc displacement, lumbar region: Secondary | ICD-10-CM | POA: Insufficient documentation

## 2015-12-06 DIAGNOSIS — Z792 Long term (current) use of antibiotics: Secondary | ICD-10-CM | POA: Insufficient documentation

## 2015-12-06 DIAGNOSIS — M5442 Lumbago with sciatica, left side: Secondary | ICD-10-CM | POA: Insufficient documentation

## 2015-12-06 DIAGNOSIS — M5136 Other intervertebral disc degeneration, lumbar region: Secondary | ICD-10-CM

## 2015-12-06 LAB — CBC
HCT: 41.8 % (ref 35.0–47.0)
HEMOGLOBIN: 13.9 g/dL (ref 12.0–16.0)
MCH: 30.3 pg (ref 26.0–34.0)
MCHC: 33.2 g/dL (ref 32.0–36.0)
MCV: 91.1 fL (ref 80.0–100.0)
PLATELETS: 315 10*3/uL (ref 150–440)
RBC: 4.59 MIL/uL (ref 3.80–5.20)
RDW: 13.9 % (ref 11.5–14.5)
WBC: 8.2 10*3/uL (ref 3.6–11.0)

## 2015-12-06 MED ORDER — KETOROLAC TROMETHAMINE 30 MG/ML IJ SOLN
30.0000 mg | Freq: Once | INTRAMUSCULAR | Status: AC
Start: 2015-12-06 — End: 2015-12-06
  Administered 2015-12-06: 30 mg via INTRAVENOUS
  Filled 2015-12-06: qty 1

## 2015-12-06 MED ORDER — DIAZEPAM 5 MG/ML IJ SOLN
2.5000 mg | Freq: Once | INTRAMUSCULAR | Status: AC
  Administered 2015-12-06: 2.5 mg via INTRAVENOUS
  Filled 2015-12-06: qty 2

## 2015-12-06 MED ORDER — OXYCODONE-ACETAMINOPHEN 5-325 MG PO TABS
1.0000 | ORAL_TABLET | Freq: Once | ORAL | Status: AC
  Administered 2015-12-06: 1 via ORAL
  Filled 2015-12-06: qty 1

## 2015-12-06 NOTE — ED Notes (Signed)
Pt presents to ED with lower back pain that radiates into her left leg. Pt reports having throbbing and numbness to "the front and back" of her left leg. Pt was sent over from [facility] after she was seen by Dr. Sherilyn Cooter.  He was concerned about the pt decrease in reflexes in her left leg. Pt states she has fallen 3 times today due to the weakness in her left leg and states it "wont support" her.

## 2015-12-06 NOTE — ED Provider Notes (Signed)
[facility] Emergency Department Provider Note  ____________________________________________  Time seen: Approximately 11:26 PM  I have reviewed the triage vital signs and the nursing notes.   HISTORY  Chief Complaint Back Pain    HPI Katelyn Rivera is a 65 y.o. female who comes in with some left leg and back pain. The patient reports that she hurt her back Sunday while washing her car. She reports she felt her back pull and then developed some pain. She reports that the pain goes into her left leg. Her leg throbs and aches. The patient is been taking Tylenol, naproxen and Flexeril but nothing has been working. The patient tried to see the chiropractor today but they said that they would not do anything until she had an MRI given the fact that the patient could have a pinched nerve or a bulging disc. The patient was sent to [facility] clinic to receive an MRI. She reports that they did an x-ray and she was sent over here given that she had decreased patellar reflexes and decreased sensation in her leg. She reports that she did hurt her back in the past but this is much different than it was previously. The lower portion of the patient's leg as feeling numb and she rates her pain a 10 out of 10 in intensity. The patient was told by the chiropractor to see the orthopedic surgeon but she is unable to get an appointment with him until February. The patient was sent over here by the physician at [facility].   Past Medical History  Diagnosis Date  . Allergy     allergic rhinitis  . Diverticulitis     Hx of    Patient Active Problem List   Diagnosis Date Noted  . Stress reaction 11/26/2011  . ACUTE ETHMOIDAL SINUSITIS 08/31/2010  . URINARY FREQUENCY 08/31/2010  . COLD SORE 10/21/2009  . RUQ PAIN 12/03/2008  . UTI 07/22/2008  . BACK PAIN 01/07/2008  . PLANTAR FASCIITIS 01/07/2008  . ALLERGIC RHINITIS 06/18/2007  . OVARIAN CYST 06/18/2007  . DIVERTICULITIS,  HX OF 06/18/2007    Past Surgical History  Procedure Laterality Date  . Tubal ligation    . Refractive surgery      Current Outpatient Rx  Name  Route  Sig  Dispense  Refill  . calcium carbonate (OS-CAL) 600 MG TABS   Oral   Take 600 mg by mouth daily.          . cetirizine (ZYRTEC) 10 MG tablet   Oral   Take 10 mg by mouth daily.           . ciprofloxacin (CIPRO) 500 MG tablet   Oral   Take 1 tablet (500 mg total) by mouth 2 (two) times daily.   6 tablet   0   . conjugated estrogens (PREMARIN) vaginal cream   Vaginal   Place vaginally once a week.          . diazepam (VALIUM) 2 MG tablet   Oral   Take 1 tablet (2 mg total) by mouth every 8 (eight) hours as needed for anxiety.   30 tablet   0   . fluticasone (FLONASE) 50 MCG/ACT nasal spray   Nasal   Place 2 sprays into the nose 2 (two) times daily.           . folic acid (FOLVITE) A999333 MCG tablet   Oral   Take 800 mcg by mouth daily.          Marland Kitchen  Glucosamine 500 MG CAPS   Oral   Take 1 capsule by mouth daily.           . hydrocortisone (ANUSOL-HC) 25 MG suppository      Unwrap and insert 1 suppository per rectum up to 3 times a day as needed for hemorrhoid . Do not used for more than 3 days in a row.   12 suppository   0   . metroNIDAZOLE (FLAGYL) 500 MG tablet   Oral   Take 1 tablet (500 mg total) by mouth 3 (three) times daily.   9 tablet   0   . Multiple Vitamin (MULTIVITAMIN) tablet   Oral   Take 1 tablet by mouth daily.           Marland Kitchen oxyCODONE-acetaminophen (ROXICET) 5-325 MG tablet   Oral   Take 1 tablet by mouth every 6 (six) hours as needed.   12 tablet   0   . predniSONE (DELTASONE) 20 MG tablet   Oral   Take 3 tablets (60 mg total) by mouth daily.   12 tablet   0   . Probiotic Product (ALIGN PO)   Oral   Take by mouth daily.          . valACYclovir (VALTREX) 500 MG tablet      Take 2 tablets by mouth at onset of cold sore then 2 tablets 12 hrs later.   8 tablet    2   . vitamin E 400 UNIT capsule   Oral   Take 400 Units by mouth daily.             Allergies Tetracycline  Family History  Problem Relation Age of Onset  . Diverticulitis Mother   . Cancer Father     brain tumor  . Diverticulitis Sister     Social History Social History  Substance Use Topics  . Smoking status: Never Smoker   . Smokeless tobacco: None  . Alcohol Use: No    Review of Systems Constitutional: No fever/chills Eyes: No visual changes. ENT: No sore throat. Cardiovascular: Denies chest pain. Respiratory: Denies shortness of breath. Gastrointestinal: No abdominal pain.  No nausea, no vomiting.  No diarrhea.  No constipation. Genitourinary: Negative for dysuria. Musculoskeletal: Left-sided back pain and left leg pain Skin: Negative for rash. Neurological: Left leg numbness  10-point ROS otherwise negative.  ____________________________________________   PHYSICAL EXAM:  VITAL SIGNS: ED Triage Vitals  Enc Vitals Group     BP 12/06/15 1918 174/76 mmHg     Pulse Rate 12/06/15 1918 66     Resp 12/06/15 1918 18     Temp 12/06/15 1918 98.1 F (36.7 C)     Temp Source 12/06/15 1918 Oral     SpO2 12/06/15 1918 100 %     Weight 12/06/15 1918 170 lb (77.111 kg)     Height 12/06/15 1918 5\' 4"  (1.626 m)     Head Cir --      Peak Flow --      Pain Score 12/06/15 1918 9     Pain Loc --      Pain Edu? --      Excl. in [place]? --     Constitutional: Alert and oriented. Well appearing and in moderate distress. Eyes: Conjunctivae are normal. PERRL. EOMI. Head: Atraumatic. Nose: No congestion/rhinnorhea. Mouth/Throat: Mucous membranes are moist.  Oropharynx non-erythematous. Cardiovascular: Normal rate, regular rhythm. Grossly normal heart sounds.  Good peripheral circulation. Respiratory: Normal respiratory effort.  No  retractions. Lungs CTAB. Gastrointestinal: Soft and nontender. No distention. Positive bowel sounds Musculoskeletal: Pain with passive  range of motion of the patient's leg, pain to left SI joint Neurologic:  Normal speech and language. Decreased sensation to patient's left lower leg to light touch. Skin:  Skin is warm, dry and intact.  Psychiatric: Mood and affect are normal.   ____________________________________________   LABS (all labs ordered are listed, but only abnormal results are displayed)  Labs Reviewed  BASIC METABOLIC PANEL - Abnormal; Notable for the following:    Glucose, Bld 105 (*)    All other components within normal limits  CBC   ____________________________________________  EKG  none ____________________________________________  RADIOLOGY  Lumbar spine MRI: No acute fracture or malalignment on this mildly motion degraded examination, degenerative lumbar spine superimposed on a background of borderline congenital canal narrowing, moderate to severe canal stenosis at L4-5 moderate at L2-3 and L3-4, neural foraminal narrowing L2-3 through L5-S1 moderate to severe on the left at L5-S1. At least 5.3 cm cystic mass in mid to the left pelvis for which dedicated pelvic ultrasound is recommended on a nonemergent basis. ____________________________________________   PROCEDURES  Procedure(s) performed: None  Critical Care performed: No  ____________________________________________   INITIAL IMPRESSION / ASSESSMENT AND PLAN / ED COURSE  Pertinent labs & imaging results that were available during my care of the patient were reviewed by me and considered in my medical decision making (see chart for details).  This is a 65 year old female who comes into the hospital today with some left leg pain and back pain after pulling her back washing her car. I will give the patient dose of Toradol, Percocet and Valium and I will reassess the patient after she received the medication. I will attempt to perform an MRI given the patient's leg numbness to determine if she has a bulging disc or any further nerve  impingement.  This is a 65 year old female who comes into the hospital today with some back pain. Patient does have some significant degenerative disc disease with disc bulging at multiple levels. The patient did receive her medication and reports she does still have some discomfort but it is much improved from previous. The patient needs to follow-up with orthopedic surgery or neurosurgery to determine if this bulging needs surgical intervention or medical intervention. I will give the patient a dose of prednisone and put her on a course of steroids as well as pain medicine and Valium. The patient will be discharged to follow-up with her physician. ____________________________________________   FINAL CLINICAL IMPRESSION(S) / ED DIAGNOSES  Final diagnoses:  Left leg numbness  Right-sided low back pain with left-sided sciatica  Bulging lumbar disc      Loney Hering, MD 12/07/15 [phone]

## 2015-12-06 NOTE — ED Notes (Signed)
C/o back pain since Sunday, runs down her left leg.

## 2015-12-07 ENCOUNTER — Emergency Department: Payer: Self-pay

## 2015-12-07 LAB — BASIC METABOLIC PANEL
ANION GAP: 6 (ref 5–15)
BUN: 13 mg/dL (ref 6–20)
CHLORIDE: 105 mmol/L (ref 101–111)
CO2: 27 mmol/L (ref 22–32)
Calcium: 9.4 mg/dL (ref 8.9–10.3)
Creatinine, Ser: 0.68 mg/dL (ref 0.44–1.00)
GFR calc Af Amer: >60 mL/min (ref >60)
GFR calc non Af Amer: >60 mL/min (ref >60)
GLUCOSE: 105 mg/dL — AB (ref 65–99)
POTASSIUM: 3.9 mmol/L (ref 3.5–5.1)
SODIUM: 138 mmol/L (ref 135–145)

## 2015-12-07 MED ORDER — PREDNISONE 20 MG PO TABS: 60.0000 mg | ORAL_TABLET | Freq: Every day | ORAL | Status: DC

## 2015-12-07 MED ORDER — MORPHINE SULFATE (PF) 4 MG/ML IV SOLN
4.0000 mg | Freq: Once | INTRAVENOUS | Status: AC
  Administered 2015-12-07: 4 mg via INTRAVENOUS

## 2015-12-07 MED ORDER — PREDNISONE 20 MG PO TABS
60.0000 mg | ORAL_TABLET | Freq: Once | ORAL | Status: AC
  Administered 2015-12-07: 60 mg via ORAL
  Filled 2015-12-07: qty 3

## 2015-12-07 MED ORDER — DIAZEPAM 2 MG PO TABS: 2.0000 mg | ORAL_TABLET | Freq: Three times a day (TID) | ORAL | Status: AC | PRN

## 2015-12-07 MED ORDER — MORPHINE SULFATE (PF) 4 MG/ML IV SOLN
INTRAVENOUS | Status: AC
  Administered 2015-12-07: 4 mg via INTRAVENOUS
  Filled 2015-12-07: qty 1

## 2015-12-07 MED ORDER — OXYCODONE-ACETAMINOPHEN 5-325 MG PO TABS: 1.0000 | ORAL_TABLET | Freq: Four times a day (QID) | ORAL | Status: DC | PRN

## 2015-12-07 NOTE — ED Notes (Signed)
MRI called pt unable to lie flat, informed Dr Dahlia Client, new orders carried out.

## 2015-12-07 NOTE — ED Notes (Signed)
Patient transported to MRI 

## 2015-12-07 NOTE — Discharge Instructions (Signed)
Herniated Disk A herniated disk occurs when a disk in your spine bulges out too far. Your spine (backbone) is made up of bones called vertebrae. A disk with a spongy center is located between each pair of bones. These disks act as shock absorbers when you move. A herniated disk can cause pain and muscle weakness.  HOME CARE  Take all medicines as told by your doctor.  Rest for 2 days and then start moving.  Do not sit or stand for long periods of time.  Maintain good posture when sitting and standing.  Avoid moving in a way that causes pain, such as bending or lifting.  When you are able to start lifting things again:  [place] with your knees.  Keep your back straight.  Hold heavy objects close to your body.  If you are overweight, ask your doctor about starting a weight-loss program.  When you are able to start exercising, ask your doctor how much and what type of exercise is best for you.  Work with a physical therapist on stretching and strengthening exercises for your back.  Do not wear high-heeled shoes.  Do not sleep on your belly.  Do not smoke.  Keep all follow-up visits as told by your doctor. GET HELP IF:  You have back or neck pain that is not getting better after 4 weeks.  You have very bad pain in your back or neck.  You have a loss of feeling (numbness), tingling, or weakness along with pain. GET HELP RIGHT AWAY IF:  You have tingling, weakness, or loss of feeling that makes you unable to use your arms or legs.  You are not able to control when you pee (urinate) or poop (bowel movement).  You have dizziness or fainting.  You have shortness of breath. MAKE SURE YOU:  Understand these instructions.  Will watch your condition.  Will get help right away if you are not doing well or get worse.   This information is not intended to replace advice given to you by your health care provider. Make sure you discuss any questions you have with your health  care provider.   Document Released: 03/22/2014 Document Reviewed: 03/22/2014 Elsevier Interactive Patient Education 2016 Elsevier Inc.  Radicular Pain Radicular pain in either the arm or leg is usually from a bulging or herniated disk in the spine. A piece of the herniated disk may press against the nerves as the nerves exit the spine. This causes pain which is felt at the tips of the nerves down the arm or leg. Other causes of radicular pain may include:  Fractures.  Heart disease.  Cancer.  An abnormal and usually degenerative state of the nervous system or nerves (neuropathy). Diagnosis may require CT or MRI scanning to determine the primary cause.  Nerves that start at the neck (nerve roots) may cause radicular pain in the outer shoulder and arm. It can spread down to the thumb and fingers. The symptoms vary depending on which nerve root has been affected. In most cases radicular pain improves with conservative treatment. Neck problems may require physical therapy, a neck collar, or cervical traction. Treatment may take many weeks, and surgery may be considered if the symptoms do not improve.  Conservative treatment is also recommended for sciatica. Sciatica causes pain to radiate from the lower back or buttock area down the leg into the foot. Often there is a history of back problems. Most patients with sciatica are better after 2 to 4 weeks  of rest and other supportive care. Short term bed rest can reduce the disk pressure considerably. Sitting, however, is not a good position since this increases the pressure on the disk. You should avoid bending, lifting, and all other activities which make the problem worse. Traction can be used in severe cases. Surgery is usually reserved for patients who do not improve within the first months of treatment. Only take over-the-counter or prescription medicines for pain, discomfort, or fever as directed by your caregiver. Narcotics and muscle relaxants may  help by relieving more severe pain and spasm and by providing mild sedation. Cold or massage can give significant relief. Spinal manipulation is not recommended. It can increase the degree of disc protrusion. Epidural steroid injections are often effective treatment for radicular pain. These injections deliver medicine to the spinal nerve in the space between the protective covering of the spinal cord and back bones (vertebrae). Your caregiver can give you more information about steroid injections. These injections are most effective when given within two weeks of the onset of pain.  You should see your caregiver for follow up care as recommended. A program for neck and back injury rehabilitation with stretching and strengthening exercises is an important part of management.  SEEK IMMEDIATE MEDICAL CARE IF:  You develop increased pain, weakness, or numbness in your arm or leg.  You develop difficulty with bladder or bowel control.  You develop abdominal pain.   This information is not intended to replace advice given to you by your health care provider. Make sure you discuss any questions you have with your health care provider.   Document Released: 12/13/2004 Document Revised: 11/26/2014 Document Reviewed: 06/01/2015 Elsevier Interactive Patient Education 2016 Elsevier Inc.  Paresthesia Paresthesia is an abnormal burning or prickling sensation. This sensation is generally felt in the hands, arms, legs, or feet. However, it may occur in any part of the body. Usually, it is not painful. The feeling may be described as:  Tingling or numbness.  Pins and needles.  Skin crawling.  Buzzing.  Limbs falling asleep.  Itching. Most people experience temporary (transient) paresthesia at some time in their lives. Paresthesia may occur when you breathe too quickly (hyperventilation). It can also occur without any apparent cause. Commonly, paresthesia occurs when pressure is placed on a nerve. The  sensation quickly goes away after the pressure is removed. For some people, however, paresthesia is a long-lasting (chronic) condition that is caused by an underlying disorder. If you continue to have paresthesia, you may need further medical evaluation. HOME CARE INSTRUCTIONS Watch your condition for any changes. Taking the following actions may help to lessen any discomfort that you are feeling:  Avoid drinking alcohol.  Try acupuncture or massage to help relieve your symptoms.  Keep all follow-up visits as directed by your health care provider. This is important. SEEK MEDICAL CARE IF:  You continue to have episodes of paresthesia.  Your burning or prickling feeling gets worse when you walk.  You have pain, cramps, or dizziness.  You develop a rash. SEEK IMMEDIATE MEDICAL CARE IF:  You feel weak.  You have trouble walking or moving.  You have problems with speech, understanding, or vision.  You feel confused.  You cannot control your bladder or bowel movements.  You have numbness after an injury.  You faint.   This information is not intended to replace advice given to you by your health care provider. Make sure you discuss any questions you have with your health care  provider.   Document Released: 10/26/2002 Document Revised: 03/22/2015 Document Reviewed: 11/01/2014 Elsevier Interactive Patient Education [employer].

## 2015-12-07 NOTE — ED Notes (Signed)
Patient transported from MRI, back to room 7, await results

## 2015-12-16 ENCOUNTER — Other Ambulatory Visit (HOSPITAL_COMMUNITY): Payer: Self-pay | Admitting: Neurosurgery

## 2015-12-20 ENCOUNTER — Encounter (HOSPITAL_COMMUNITY)
Admission: RE | Admit: 2015-12-20 | Discharge: 2015-12-20 | Disposition: A | Discharge status: Home / Self Care | Payer: Self-pay | Source: Ambulatory Visit | Attending: Neurosurgery | Admitting: Neurosurgery

## 2015-12-20 ENCOUNTER — Encounter (HOSPITAL_COMMUNITY): Payer: Self-pay

## 2015-12-20 DIAGNOSIS — M5116 Intervertebral disc disorders with radiculopathy, lumbar region: Secondary | ICD-10-CM | POA: Insufficient documentation

## 2015-12-20 DIAGNOSIS — Z01818 Encounter for other preprocedural examination: Secondary | ICD-10-CM | POA: Insufficient documentation

## 2015-12-20 HISTORY — DX: Gastro-esophageal reflux disease without esophagitis: K21.9

## 2015-12-20 HISTORY — DX: Unspecified osteoarthritis, unspecified site: M19.90

## 2015-12-20 HISTORY — DX: Intervertebral disc disorders with myelopathy, thoracic region: M51.04

## 2015-12-20 HISTORY — DX: Urinary tract infection, site not specified: N39.0

## 2015-12-20 MED ORDER — CEFAZOLIN SODIUM-DEXTROSE 2-3 GM-% IV SOLR
2.0000 g | INTRAVENOUS | Status: AC
  Administered 2015-12-21: 2 g via INTRAVENOUS
  Filled 2015-12-20: qty 50

## 2015-12-20 NOTE — Progress Notes (Signed)
Pt denies SOB, chest pain, and being under the care of a cardiologist. Pt denies having a stress test, echo and cardiac cath. A PCR was not done because pt stated that she was prescribed Bactroban PRN and has been treating herself  nasally twice a day for the last 2 weeks ( last dose was this morning). Spoke with Ebony Hail, [place], Anesthesia, regarding pt labs drawn on 12/06/15 and according to [place], [place], labs should be okay " they were drawn close to midnight on 12/06/15; if not, STAT labs can be drawn on DOS like an I -Stat."

## 2015-12-20 NOTE — Pre-Procedure Instructions (Signed)
Katelyn Rivera  12/20/2015      [facility] - [place], [place] - [id number] [place] [address] Phone: [phone] Fax: [phone]  [facility], [place] - [place] [address] Phone: [phone] Fax: [phone]    Your procedure is scheduled on Wednesday, December 21, 2015  Report to [facility] Admitting at 6:30 A.M.  Call this number if you have problems the morning of surgery:  [phone]   Remember:  Do not eat food or drink liquids after midnight Tuesday, December 20, 2015  Take these medicines the morning of surgery with A SIP OF WATER :cetirizine (ZYRTEC),  HYDROcodone-acetaminophen (NORCO/VICODIN) for pain, diazepam (VALIUM) for anxiety  Stop taking Aspirin, vitamins, fish oil and herbal medications such as Glucosamine and Lysine and Probiotic. Do not take any NSAIDs ie: Ibuprofen, Advil, Naproxen or any medication containing Aspirin; stop now.  Do not wear jewelry, make-up or nail polish.  Do not wear lotions, powders, or perfumes.  You may wear deodorant.  Do not shave 48 hours prior to surgery.   Do not bring valuables to the hospital.  [facility] is not responsible for any belongings or valuables.  Contacts, dentures or bridgework may not be worn into surgery.  Leave your suitcase in the car.  After surgery it may be brought to your room.  For patients admitted to the hospital, discharge time will be determined by your treatment team.  Patients discharged the day of surgery will not be allowed to drive home.   Name and phone number of your driver:   Special instructions:  [facility] - Preparing for Surgery  Before surgery, you can play an important role.  Because skin is not sterile, your skin needs to be as free of germs as possible.  You can reduce the number of germs on you skin by washing with CHG (chlorahexidine gluconate) soap before  surgery.  CHG is an antiseptic cleaner which kills germs and bonds with the skin to continue killing germs even after washing.  Please DO NOT use if you have an allergy to CHG or antibacterial soaps.  If your skin becomes reddened/irritated stop using the CHG and inform your nurse when you arrive at Short Stay.  Do not shave (including legs and underarms) for at least 48 hours prior to the first CHG shower.  You may shave your face.  Please follow these instructions carefully:   1.  Shower with CHG Soap the night before surgery and the morning of Surgery.  2.  If you choose to wash your hair, wash your hair first as usual with your normal shampoo.  3.  After you shampoo, rinse your hair and body thoroughly to remove the Shampoo.  4.  Use CHG as you would any other liquid soap.  You can apply chg directly  to the skin and wash gently with scrungie or a clean washcloth.  5.  Apply the CHG Soap to your body ONLY FROM THE NECK DOWN.  Do not use on open wounds or open sores.  Avoid contact with your eyes, ears, mouth and genitals (private parts).  Wash genitals (private parts) with your normal soap.  6.  Wash thoroughly, paying special attention to the area where your surgery will be performed.  7.  Thoroughly rinse your body with warm water from the neck down.  8.  DO NOT shower/wash with  your normal soap after using and rinsing off the CHG Soap.  9.  Pat yourself dry with a clean towel.            10.  Wear clean pajamas.            11.  Place clean sheets on your bed the night of your first shower and do not sleep with pets.  Day of Surgery  Do not apply any lotions/deodorants the morning of surgery.  Please wear clean clothes to the hospital/surgery center.  Please read over the following fact sheets that you were given. Pain Booklet, Coughing and Deep Breathing, MRSA Information and Surgical Site Infection Prevention

## 2015-12-21 ENCOUNTER — Encounter (HOSPITAL_COMMUNITY)
Admission: RE | Disposition: A | Discharge status: Home / Self Care | Payer: Self-pay | Source: Ambulatory Visit | Attending: Neurosurgery

## 2015-12-21 ENCOUNTER — Ambulatory Visit (HOSPITAL_COMMUNITY): Payer: Self-pay | Admitting: Anesthesiology

## 2015-12-21 ENCOUNTER — Ambulatory Visit (HOSPITAL_COMMUNITY): Payer: Self-pay

## 2015-12-21 ENCOUNTER — Encounter (HOSPITAL_COMMUNITY): Payer: Self-pay | Admitting: *Deleted

## 2015-12-21 ENCOUNTER — Ambulatory Visit (HOSPITAL_COMMUNITY): Payer: Self-pay | Admitting: Vascular Surgery

## 2015-12-21 ENCOUNTER — Observation Stay (HOSPITAL_COMMUNITY)
Admission: RE | Admit: 2015-12-21 | Discharge: 2015-12-22 | Disposition: A | Discharge status: Home / Self Care | Payer: Self-pay | Source: Ambulatory Visit | Attending: Neurosurgery | Admitting: Neurosurgery

## 2015-12-21 DIAGNOSIS — G8929 Other chronic pain: Secondary | ICD-10-CM | POA: Diagnosis present

## 2015-12-21 DIAGNOSIS — K219 Gastro-esophageal reflux disease without esophagitis: Secondary | ICD-10-CM | POA: Insufficient documentation

## 2015-12-21 DIAGNOSIS — M1990 Unspecified osteoarthritis, unspecified site: Secondary | ICD-10-CM | POA: Insufficient documentation

## 2015-12-21 DIAGNOSIS — Z8719 Personal history of other diseases of the digestive system: Secondary | ICD-10-CM | POA: Insufficient documentation

## 2015-12-21 DIAGNOSIS — M4806 Spinal stenosis, lumbar region: Secondary | ICD-10-CM | POA: Insufficient documentation

## 2015-12-21 DIAGNOSIS — M549 Dorsalgia, unspecified: Secondary | ICD-10-CM

## 2015-12-21 DIAGNOSIS — R918 Other nonspecific abnormal finding of lung field: Secondary | ICD-10-CM | POA: Insufficient documentation

## 2015-12-21 DIAGNOSIS — Z419 Encounter for procedure for purposes other than remedying health state, unspecified: Secondary | ICD-10-CM

## 2015-12-21 DIAGNOSIS — Z881 Allergy status to other antibiotic agents status: Secondary | ICD-10-CM | POA: Insufficient documentation

## 2015-12-21 DIAGNOSIS — M5116 Intervertebral disc disorders with radiculopathy, lumbar region: Principal | ICD-10-CM | POA: Insufficient documentation

## 2015-12-21 HISTORY — PX: LUMBAR LAMINECTOMY/DECOMPRESSION MICRODISCECTOMY: SHX5026

## 2015-12-21 SURGERY — LUMBAR LAMINECTOMY/DECOMPRESSION MICRODISCECTOMY 1 LEVEL
Anesthesia: General | Laterality: Left

## 2015-12-21 MED ORDER — THROMBIN 5000 UNITS EX SOLR
CUTANEOUS | Status: DC | PRN
  Administered 2015-12-21 (×2): 5000 [IU] via TOPICAL

## 2015-12-21 MED ORDER — OXYCODONE-ACETAMINOPHEN 5-325 MG PO TABS: 1.0000 | ORAL_TABLET | ORAL | Status: DC | PRN

## 2015-12-21 MED ORDER — ARTIFICIAL TEARS OP OINT
TOPICAL_OINTMENT | OPHTHALMIC | Status: DC | PRN
  Administered 2015-12-21: 1 via OPHTHALMIC

## 2015-12-21 MED ORDER — ONDANSETRON HCL 4 MG/2ML IJ SOLN
INTRAMUSCULAR | Status: AC
  Filled 2015-12-21: qty 2

## 2015-12-21 MED ORDER — PHENYLEPHRINE HCL 10 MG/ML IJ SOLN
10.0000 mg | INTRAVENOUS | Status: DC | PRN
  Administered 2015-12-21: 10 ug/min via INTRAVENOUS

## 2015-12-21 MED ORDER — FENTANYL CITRATE (PF) 100 MCG/2ML IJ SOLN
INTRAMUSCULAR | Status: DC | PRN
  Administered 2015-12-21 (×2): 50 ug via INTRAVENOUS

## 2015-12-21 MED ORDER — PROMETHAZINE HCL 25 MG/ML IJ SOLN
6.2500 mg | INTRAMUSCULAR | Status: DC | PRN
Start: 2015-12-21 — End: 2015-12-21

## 2015-12-21 MED ORDER — ACETAMINOPHEN 10 MG/ML IV SOLN
INTRAVENOUS | Status: AC
  Administered 2015-12-21: 1000 mg via INTRAVENOUS
  Filled 2015-12-21: qty 100

## 2015-12-21 MED ORDER — ACETAMINOPHEN 650 MG RE SUPP: 650.0000 mg | RECTAL | Status: DC | PRN

## 2015-12-21 MED ORDER — LIDOCAINE HCL (CARDIAC) 20 MG/ML IV SOLN
INTRAVENOUS | Status: DC | PRN
  Administered 2015-12-21: 70 mg via INTRAVENOUS

## 2015-12-21 MED ORDER — FENTANYL CITRATE (PF) 250 MCG/5ML IJ SOLN
INTRAMUSCULAR | Status: AC
  Filled 2015-12-21: qty 5

## 2015-12-21 MED ORDER — 0.9 % SODIUM CHLORIDE (POUR BTL) OPTIME
TOPICAL | Status: DC | PRN
  Administered 2015-12-21: 1000 mL

## 2015-12-21 MED ORDER — ACETAMINOPHEN 325 MG PO TABS: 650.0000 mg | ORAL_TABLET | ORAL | Status: DC | PRN

## 2015-12-21 MED ORDER — PHENOL 1.4 % MT LIQD
1.0000 | OROMUCOSAL | Status: DC | PRN
Start: 2015-12-21 — End: 2015-12-22

## 2015-12-21 MED ORDER — MORPHINE SULFATE (PF) 4 MG/ML IV SOLN: 4.0000 mg | INTRAVENOUS | Status: DC | PRN

## 2015-12-21 MED ORDER — KCL IN DEXTROSE-NACL 20-5-0.45 MEQ/L-%-% IV SOLN: INTRAVENOUS | Status: DC

## 2015-12-21 MED ORDER — GLYCOPYRROLATE 0.2 MG/ML IJ SOLN
INTRAMUSCULAR | Status: AC
  Filled 2015-12-21: qty 1

## 2015-12-21 MED ORDER — KETOROLAC TROMETHAMINE 30 MG/ML IJ SOLN
30.0000 mg | Freq: Four times a day (QID) | INTRAMUSCULAR | Status: DC
Start: 2015-12-21 — End: 2015-12-22
  Administered 2015-12-21 (×2): 30 mg via INTRAVENOUS
  Filled 2015-12-21 (×2): qty 1

## 2015-12-21 MED ORDER — ROCURONIUM BROMIDE 100 MG/10ML IV SOLN
INTRAVENOUS | Status: DC | PRN
  Administered 2015-12-21: 45 mg via INTRAVENOUS
  Administered 2015-12-21: 5 mg via INTRAVENOUS

## 2015-12-21 MED ORDER — HEMOSTATIC AGENTS (NO CHARGE) OPTIME
TOPICAL | Status: DC | PRN
  Administered 2015-12-21: 1 via TOPICAL

## 2015-12-21 MED ORDER — ARTIFICIAL TEARS OP OINT
TOPICAL_OINTMENT | OPHTHALMIC | Status: AC
  Filled 2015-12-21: qty 3.5

## 2015-12-21 MED ORDER — HYDROXYZINE HCL 50 MG/ML IM SOLN: 50.0000 mg | INTRAMUSCULAR | Status: DC | PRN

## 2015-12-21 MED ORDER — NEOSTIGMINE METHYLSULFATE 10 MG/10ML IV SOLN
INTRAVENOUS | Status: DC | PRN
  Administered 2015-12-21: 3 mg via INTRAVENOUS

## 2015-12-21 MED ORDER — GLYCOPYRROLATE 0.2 MG/ML IJ SOLN
INTRAMUSCULAR | Status: DC | PRN
  Administered 2015-12-21: .2 mg via INTRAVENOUS

## 2015-12-21 MED ORDER — FENTANYL CITRATE (PF) 100 MCG/2ML IJ SOLN
INTRAMUSCULAR | Status: AC
  Filled 2015-12-21: qty 2

## 2015-12-21 MED ORDER — SODIUM CHLORIDE 0.9% FLUSH: 3.0000 mL | INTRAVENOUS | Status: DC | PRN

## 2015-12-21 MED ORDER — MENTHOL 3 MG MT LOZG
1.0000 | LOZENGE | OROMUCOSAL | Status: DC | PRN
Start: 2015-12-21 — End: 2015-12-22

## 2015-12-21 MED ORDER — ONDANSETRON HCL 4 MG/2ML IJ SOLN: 4.0000 mg | Freq: Four times a day (QID) | INTRAMUSCULAR | Status: DC | PRN

## 2015-12-21 MED ORDER — CYCLOBENZAPRINE HCL 10 MG PO TABS: 10.0000 mg | ORAL_TABLET | Freq: Three times a day (TID) | ORAL | Status: DC | PRN

## 2015-12-21 MED ORDER — LIDOCAINE HCL (CARDIAC) 20 MG/ML IV SOLN
INTRAVENOUS | Status: AC
  Filled 2015-12-21: qty 5

## 2015-12-21 MED ORDER — HYDROCODONE-ACETAMINOPHEN 5-325 MG PO TABS
1.0000 | ORAL_TABLET | ORAL | Status: DC | PRN
  Administered 2015-12-22: 2 via ORAL
  Filled 2015-12-21: qty 2

## 2015-12-21 MED ORDER — LIDOCAINE-EPINEPHRINE 1 %-1:100000 IJ SOLN
INTRAMUSCULAR | Status: DC | PRN
  Administered 2015-12-21: 30 mL

## 2015-12-21 MED ORDER — ALUM & MAG HYDROXIDE-SIMETH 200-200-20 MG/5ML PO SUSP: 30.0000 mL | Freq: Four times a day (QID) | ORAL | Status: DC | PRN

## 2015-12-21 MED ORDER — PROPOFOL 10 MG/ML IV BOLUS
INTRAVENOUS | Status: AC
  Filled 2015-12-21: qty 20

## 2015-12-21 MED ORDER — ROCURONIUM BROMIDE 50 MG/5ML IV SOLN
INTRAVENOUS | Status: AC
  Filled 2015-12-21: qty 1

## 2015-12-21 MED ORDER — BUPIVACAINE HCL (PF) 0.5 % IJ SOLN
INTRAMUSCULAR | Status: DC | PRN
  Administered 2015-12-21: 30 mL

## 2015-12-21 MED ORDER — LACTATED RINGERS IV SOLN
INTRAVENOUS | Status: DC | PRN
  Administered 2015-12-21 (×2): via INTRAVENOUS

## 2015-12-21 MED ORDER — MIDAZOLAM HCL 2 MG/2ML IJ SOLN
INTRAMUSCULAR | Status: AC
  Filled 2015-12-21: qty 2

## 2015-12-21 MED ORDER — SUCCINYLCHOLINE CHLORIDE 20 MG/ML IJ SOLN
INTRAMUSCULAR | Status: AC
  Filled 2015-12-21: qty 1

## 2015-12-21 MED ORDER — HYDROCODONE-ACETAMINOPHEN 5-325 MG PO TABS: 1.0000 | ORAL_TABLET | ORAL | Status: DC | PRN

## 2015-12-21 MED ORDER — SODIUM CHLORIDE 0.9% FLUSH
3.0000 mL | Freq: Two times a day (BID) | INTRAVENOUS | Status: DC
  Administered 2015-12-21: 3 mL via INTRAVENOUS

## 2015-12-21 MED ORDER — ONDANSETRON HCL 4 MG/2ML IJ SOLN
INTRAMUSCULAR | Status: DC | PRN
  Administered 2015-12-21: 4 mg via INTRAVENOUS

## 2015-12-21 MED ORDER — HYDROMORPHONE HCL 1 MG/ML IJ SOLN: 0.2500 mg | INTRAMUSCULAR | Status: DC | PRN

## 2015-12-21 MED ORDER — MAGNESIUM HYDROXIDE 400 MG/5ML PO SUSP: 30.0000 mL | Freq: Every day | ORAL | Status: DC | PRN

## 2015-12-21 MED ORDER — SODIUM CHLORIDE 0.9 % IJ SOLN
INTRAMUSCULAR | Status: AC
  Filled 2015-12-21: qty 10

## 2015-12-21 MED ORDER — KETOROLAC TROMETHAMINE 30 MG/ML IJ SOLN
30.0000 mg | Freq: Once | INTRAMUSCULAR | Status: AC
Start: 2015-12-21 — End: 2015-12-21
  Administered 2015-12-21: 30 mg via INTRAVENOUS

## 2015-12-21 MED ORDER — EPHEDRINE SULFATE 50 MG/ML IJ SOLN
INTRAMUSCULAR | Status: AC
  Filled 2015-12-21: qty 1

## 2015-12-21 MED ORDER — BISACODYL 10 MG RE SUPP: 10.0000 mg | Freq: Every day | RECTAL | Status: DC | PRN

## 2015-12-21 MED ORDER — PROPOFOL 10 MG/ML IV BOLUS
INTRAVENOUS | Status: DC | PRN
  Administered 2015-12-21: 150 mg via INTRAVENOUS
  Administered 2015-12-21: 50 mg via INTRAVENOUS

## 2015-12-21 MED ORDER — ONDANSETRON HCL 4 MG PO TABS
4.0000 mg | ORAL_TABLET | Freq: Four times a day (QID) | ORAL | Status: DC | PRN
Start: 2015-12-21 — End: 2015-12-22

## 2015-12-21 MED ORDER — HYDROXYZINE HCL 25 MG PO TABS: 50.0000 mg | ORAL_TABLET | ORAL | Status: DC | PRN

## 2015-12-21 MED ORDER — SODIUM CHLORIDE 0.9 % IV SOLN: 250.0000 mL | INTRAVENOUS | Status: DC

## 2015-12-21 MED ORDER — SODIUM CHLORIDE 0.9 % IR SOLN
Status: DC | PRN
  Administered 2015-12-21: 08:00:00

## 2015-12-21 MED ORDER — KETOROLAC TROMETHAMINE 30 MG/ML IJ SOLN
INTRAMUSCULAR | Status: AC
  Filled 2015-12-21: qty 1

## 2015-12-21 SURGICAL SUPPLY — 62 items
ADH SKN CLS APL DERMABOND .7 (GAUZE/BANDAGES/DRESSINGS) ×2
APL SKNCLS STERI-STRIP NONHPOA (GAUZE/BANDAGES/DRESSINGS)
BAG DECANTER FOR FLEXI CONT (MISCELLANEOUS) ×2 IMPLANT
BENZOIN TINCTURE PRP APPL 2/3 (GAUZE/BANDAGES/DRESSINGS) IMPLANT
BLADE CLIPPER SURG (BLADE) IMPLANT
BRUSH SCRUB EZ PLAIN DRY (MISCELLANEOUS) ×2 IMPLANT
BUR ACORN 6.0 ACORN (BURR) IMPLANT
BUR ACRON 5.0MM COATED (BURR) IMPLANT
BUR MATCHSTICK NEURO 3.0 LAGG (BURR) ×2 IMPLANT
CANISTER SUCT 3000ML PPV (MISCELLANEOUS) ×2 IMPLANT
DERMABOND ADVANCED (GAUZE/BANDAGES/DRESSINGS) ×2
DERMABOND ADVANCED .7 DNX12 (GAUZE/BANDAGES/DRESSINGS) IMPLANT
DRAPE LAPAROTOMY 100X72X124 (DRAPES) ×2 IMPLANT
DRAPE MICROSCOPE LEICA (MISCELLANEOUS) ×2 IMPLANT
DRAPE POUCH INSTRU U-SHP 10X18 (DRAPES) ×2 IMPLANT
DRSG EMULSION OIL 3X3 NADH (GAUZE/BANDAGES/DRESSINGS) IMPLANT
ELECT REM PT RETURN 9FT ADLT (ELECTROSURGICAL) ×2
ELECTRODE REM PT RTRN 9FT ADLT (ELECTROSURGICAL) ×1 IMPLANT
GAUZE SPONGE 4X4 12PLY STRL (GAUZE/BANDAGES/DRESSINGS) IMPLANT
GAUZE SPONGE 4X4 16PLY XRAY LF (GAUZE/BANDAGES/DRESSINGS) IMPLANT
GLOVE BIO SURGEON STRL SZ8 (GLOVE) ×1 IMPLANT
GLOVE BIOGEL PI IND STRL 8 (GLOVE) ×1 IMPLANT
GLOVE BIOGEL PI INDICATOR 8 (GLOVE) ×1
GLOVE ECLIPSE 7.5 STRL STRAW (GLOVE) ×2 IMPLANT
GLOVE EXAM NITRILE LRG STRL (GLOVE) IMPLANT
GLOVE EXAM NITRILE MD LF STRL (GLOVE) IMPLANT
GLOVE EXAM NITRILE XL STR (GLOVE) IMPLANT
GLOVE EXAM NITRILE XS STR PU (GLOVE) IMPLANT
GLOVE INDICATOR 7.0 STRL GRN (GLOVE) ×2 IMPLANT
GLOVE INDICATOR 7.5 STRL GRN (GLOVE) ×1 IMPLANT
GLOVE SS N UNI LF 8.5 STRL (GLOVE) ×1 IMPLANT
GLOVE SURG SS PI 6.5 STRL IVOR (GLOVE) ×4 IMPLANT
GOWN STRL REUS W/ TWL LRG LVL3 (GOWN DISPOSABLE) ×1 IMPLANT
GOWN STRL REUS W/ TWL XL LVL3 (GOWN DISPOSABLE) IMPLANT
GOWN STRL REUS W/TWL 2XL LVL3 (GOWN DISPOSABLE) IMPLANT
GOWN STRL REUS W/TWL LRG LVL3 (GOWN DISPOSABLE) ×2
GOWN STRL REUS W/TWL XL LVL3 (GOWN DISPOSABLE) ×6
KIT BASIN OR (CUSTOM PROCEDURE TRAY) ×2 IMPLANT
KIT ROOM TURNOVER OR (KITS) ×2 IMPLANT
NDL HYPO 18GX1.5 BLUNT FILL (NEEDLE) IMPLANT
NDL SPNL 18GX3.5 QUINCKE PK (NEEDLE) ×1 IMPLANT
NDL SPNL 22GX3.5 QUINCKE BK (NEEDLE) ×1 IMPLANT
NEEDLE HYPO 18GX1.5 BLUNT FILL (NEEDLE) ×2 IMPLANT
NEEDLE SPNL 18GX3.5 QUINCKE PK (NEEDLE) ×2 IMPLANT
NEEDLE SPNL 22GX3.5 QUINCKE BK (NEEDLE) ×2 IMPLANT
NS IRRIG 1000ML POUR BTL (IV SOLUTION) ×2 IMPLANT
PACK LAMINECTOMY NEURO (CUSTOM PROCEDURE TRAY) ×2 IMPLANT
PAD ARMBOARD 7.5X6 YLW CONV (MISCELLANEOUS) ×6 IMPLANT
PATTIES SURGICAL .5 X1 (DISPOSABLE) ×2 IMPLANT
RUBBERBAND STERILE (MISCELLANEOUS) ×4 IMPLANT
SPONGE LAP 4X18 X RAY DECT (DISPOSABLE) IMPLANT
SPONGE SURGIFOAM ABS GEL SZ50 (HEMOSTASIS) ×2 IMPLANT
STRIP CLOSURE SKIN 1/2X4 (GAUZE/BANDAGES/DRESSINGS) IMPLANT
SUT PROLENE 6 0 BV (SUTURE) IMPLANT
SUT VIC AB 1 CT1 18XBRD ANBCTR (SUTURE) ×1 IMPLANT
SUT VIC AB 1 CT1 8-18 (SUTURE) ×4
SUT VIC AB 2-0 CP2 18 (SUTURE) ×3 IMPLANT
SUT VIC AB 3-0 SH 8-18 (SUTURE) IMPLANT
SYR 5ML LL (SYRINGE) IMPLANT
TOWEL OR 17X24 6PK STRL BLUE (TOWEL DISPOSABLE) ×2 IMPLANT
TOWEL OR 17X26 10 PK STRL BLUE (TOWEL DISPOSABLE) ×2 IMPLANT
WATER STERILE IRR 1000ML POUR (IV SOLUTION) ×2 IMPLANT

## 2015-12-21 NOTE — Anesthesia Preprocedure Evaluation (Addendum)
Anesthesia Evaluation  Patient identified by MRN, date of birth, ID band Patient awake    Reviewed: Allergy & Precautions, NPO status , Patient's Chart, lab work & pertinent test results  History of Anesthesia Complications Negative for: history of anesthetic complications  Airway Mallampati: II  TM Distance: >3 FB Neck ROM: Full    Dental  (+) Teeth Intact, Caps,    Pulmonary neg pulmonary ROS,    breath sounds clear to auscultation       Cardiovascular negative cardio ROS   Rhythm:Regular Rate:Normal     Neuro/Psych    GI/Hepatic Neg liver ROS, GERD  ,  Endo/Other  negative endocrine ROS  Renal/GU negative Renal ROS     Musculoskeletal   Abdominal   Peds  Hematology negative hematology ROS (+)   Anesthesia Other Findings   Reproductive/Obstetrics                            Anesthesia Physical Anesthesia Plan  ASA: I  Anesthesia Plan: General   Post-op Pain Management:    Induction: Intravenous  Airway Management Planned: Oral ETT  Additional Equipment:   Intra-op Plan:   Post-operative Plan: Extubation in OR  Informed Consent: I have reviewed the patients History and Physical, chart, labs and discussed the procedure including the risks, benefits and alternatives for the proposed anesthesia with the patient or authorized representative who has indicated his/her understanding and acceptance.   Dental advisory given  Plan Discussed with: CRNA and Surgeon  Anesthesia Plan Comments:         Anesthesia Quick Evaluation

## 2015-12-21 NOTE — Progress Notes (Signed)
Filed Vitals:   12/21/15 1030 12/21/15 1042 12/21/15 1103 12/21/15 1628  BP:   168/82 150/79  Pulse: 69 75 67 89  Temp:  97.9 F (36.6 C) 97.5 F (36.4 C)   TempSrc:      Resp: 36 12 18 20   Height:      Weight:      SpO2: 97% 98% 97% 96%    Patient with excellent relief of left lumbar radicular pain. Has been up and ambulating in the halls. Small amount of bloody drainage from inferior aspect of incision. Dressing applied. Have reviewed discharge instructions and wound care instructions with the patient, her husband, and the nursing staff.  Plan: Encouraged to ambulate in the halls seizing. We'll continue to progress to postoperative recovery. Dr. Kary Kos to cover until I return on Tuesday, February 7.  Hosie Spangle, MD 12/21/2015, 5:38 PM

## 2015-12-21 NOTE — Anesthesia Postprocedure Evaluation (Signed)
Anesthesia Post Note  Patient: Katelyn Rivera  Procedure(s) Performed: Procedure(s) (LRB): Left lumbar four-fivelumbar laminotomy and microdiscectomy (Left)  Patient location during evaluation: PACU Anesthesia Type: General Level of consciousness: awake and alert Pain management: pain level controlled Vital Signs Assessment: post-procedure vital signs reviewed and stable Respiratory status: spontaneous breathing, nonlabored ventilation, respiratory function stable and patient connected to nasal cannula oxygen Cardiovascular status: blood pressure returned to baseline and stable Postop Assessment: no signs of nausea or vomiting Anesthetic complications: no    Last Vitals:  Filed Vitals:   12/21/15 1030 12/21/15 1042  BP:    Pulse: 69 75  Temp:  36.6 C  Resp: 36 12    Last Pain:  Filed Vitals:   12/21/15 1043  PainSc: 4                  Preeti Winegardner,JAMES TERRILL

## 2015-12-21 NOTE — Transfer of Care (Signed)
Immediate Anesthesia Transfer of Care Note  Patient: Katelyn Rivera  Procedure(s) Performed: Procedure(s): Left lumbar four-fivelumbar laminotomy and microdiscectomy (Left)  Patient Location: PACU  Anesthesia Type:General  Level of Consciousness: awake, sedated and patient cooperative  Airway & Oxygen Therapy: Patient Spontanous Breathing and Patient connected to face mask oxygen  Post-op Assessment: Report given to RN, Patient moving all extremities and Patient moving all extremities X 4  Post vital signs: Reviewed and stable  Last Vitals:  Filed Vitals:   12/21/15 0715  BP: 173/100  Pulse: 95  Temp: 37 C  Resp: 16    Complications: No apparent anesthesia complications

## 2015-12-21 NOTE — Op Note (Signed)
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  PRE-OPERATIVE DIAGNOSIS:  Left L4-5 lumbar disc herniation, left L4-5 lateral recess stenosis, lumbar spondylosis, lumbar degenerative disease, left lumbar radiculopathy  POST-OPERATIVE DIAGNOSIS:  Left L4-5 lumbar disc herniation, left L4-5 lateral recess stenosis, lumbar spondylosis, lumbar degenerative disease, left lumbar radiculopathy  PROCEDURE:  Procedure(s):  Left L4-5 lumbar laminotomy and medial facetectomy, with decompression of lateral recess and neural foraminal stenosis with decompression of the stenotic compression of the exiting L4 and L5 nerve roots, and left L4-5 microdiscectomy, with micro-section, microsurgical technique, and the operating microscope  SURGEON:  Surgeon(s): Jovita Gamma, MD Kary Kos, MD  ASSISTANTS: Kary Kos, M.D.  ANESTHESIA:   general  EBL:  25 mL  BLOOD ADMINISTERED:none  COUNT: Correct per nursing staff  DICTATION: Patient was brought to the operating room and placed under general endotracheal anesthesia. Patient was turned to prone position the lumbar region was prepped with Betadine soap and solution and draped in a sterile fashion. The midline was infiltrated with local anesthetic with epinephrine. A localizing x-ray was taken and the L4-5 level was identified. Midline incision was made over the L4-5 level and was carried down through the subcutaneous tissue to the lumbar fascia. The lumbar fascia was incised on the left side and the paraspinal muscles were dissected from the spinous processes and lamina in a subperiosteal fashion. Another x-ray was taken and the L4-5 intralaminar space was identified. The operating microscope was draped and brought into the field provided additional magnification, illumination, and visualization. Laminotomy and medial facetectomy was performed using the high-speed drill and Kerrison punches. The ligamentum flavum was carefully resected. The  underlying thecal sac and L4 and L5 nerve roots were identified. Foraminotomies were performed for both the none compression of the exiting L4 and L5 nerve roots. The disc herniation was identified in the left L4-5 neural foramen, compressing the exiting left L4 nerve root.  The thecal sac and nerve root gently retracted medially. The small free fragment disc herniation was carefully removed, decompressing the exiting left L4 nerve root within the left L4-5 neural foramen. Once the discectomy was completed and good decompression of the thecal sac and nerve roots had been achieved, hemostasis was established with the use of bipolar cautery and Gelfoam with thrombin. The Gelfoam was removed and hemostasis confirmed. We then instilled 2 cc of fentanyl and 80 mg of Depo-Medrol into the epidural space. Deep fascia was closed with interrupted undyed 1 Vicryl sutures. Scarpa's fascia was closed with interrupted undyed 1 Vicryl sutures in the subcutaneous and subcuticular layer were closed with interrupted inverted 2-0 undyed Vicryl sutures. The skin edges were approximated with Dermabond. Following surgery the patient was turned back to a supine position to be reversed from the anesthetic extubated and transferred to the recovery room for further care.   PLAN OF CARE: Admit for overnight observation  PATIENT DISPOSITION:  PACU - hemodynamically stable.   Delay start of Pharmacological VTE agent (>24hrs) due to surgical blood loss or risk of bleeding:  yes

## 2015-12-21 NOTE — H&P (Signed)
Subjective: Patient is a 65 y.o. right handed white female who is admitted for treatment of a disabling left lumbar radiculopathy secondary to a left L4-5 free fragment foraminal disc herniation as well as for decompression of left L4-5 lateral recess stenosis. Patient developed acute pain to half weeks ago after bending overall washing her car. With painful left side of low back reading of the left lower extremity. Patient progressively worsened. She was treated with prednisone and oxycodone, but with only minimal relief.MRI was done which revealed multilevel degeneration, with an acute finding of a free fragment in the left L4-5 neural foramen, compressing the left L4 nerve root, but there was also significant stenosis seen both at L3-4 and L4-5 with in particular marked to severe left L4-5 lateral recess stenosis. Patient is admitted now for a left L4-5 lumbar laminotomy and microdiscectomy.   Patient Active Problem List   Diagnosis Date Noted  . Stress reaction 11/26/2011  . ACUTE ETHMOIDAL SINUSITIS 08/31/2010  . URINARY FREQUENCY 08/31/2010  . COLD SORE 10/21/2009  . RUQ PAIN 12/03/2008  . UTI 07/22/2008  . BACK PAIN 01/07/2008  . PLANTAR FASCIITIS 01/07/2008  . ALLERGIC RHINITIS 06/18/2007  . OVARIAN CYST 06/18/2007  . DIVERTICULITIS, HX OF 06/18/2007   Past Medical History  Diagnosis Date  . Allergy     allergic rhinitis  . Diverticulitis     Hx of  . HNP (herniated nucleus pulposus with myelopathy), thoracic   . GERD (gastroesophageal reflux disease)   . Arthritis   . UTI (lower urinary tract infection)     Past Surgical History  Procedure Laterality Date  . Tubal ligation    . Refractive surgery    . Colonoscopy      Prescriptions prior to admission  Medication Sig Dispense Refill Last Dose  . cetirizine (ZYRTEC) 10 MG tablet Take 10 mg by mouth daily.     Past Week at Unknown time  . cholecalciferol (VITAMIN D) 1000 units tablet Take 1,000 Units by mouth daily.    12/20/2015 at Unknown time  . diazepam (VALIUM) 2 MG tablet Take 1 tablet (2 mg total) by mouth every 8 (eight) hours as needed for anxiety. 30 tablet 0 12/21/2015 at 0530  . folic acid (FOLVITE) A999333 MCG tablet Take 800 mcg by mouth daily.    12/20/2015 at Unknown time  . Glucosamine 500 MG CAPS Take 1 capsule by mouth daily.     12/20/2015 at Unknown time  . HYDROcodone-acetaminophen (NORCO/VICODIN) 5-325 MG tablet Take 1 tablet by mouth every 8 (eight) hours as needed for moderate pain.   12/21/2015 at 0530  . Lysine 500 MG TABS Take 1 tablet by mouth daily.   12/20/2015 at Unknown time  . Omega-3 Fatty Acids (FISH OIL) 1000 MG CAPS Take 1 capsule by mouth daily.   12/20/2015 at Unknown time  . Probiotic Product (ALIGN) 4 MG CAPS Take 1 capsule by mouth daily.   12/20/2015 at Unknown time  . vitamin B-12 (CYANOCOBALAMIN) 1000 MCG tablet Take 1,000 mcg by mouth daily.   12/20/2015 at Unknown time   Allergies  Allergen Reactions  . Tetracycline     REACTION: rash    Social History  Substance Use Topics  . Smoking status: Never Smoker   . Smokeless tobacco: Never Used  . Alcohol Use: No    Family History  Problem Relation Age of Onset  . Diverticulitis Mother   . Alzheimer's disease Mother   . Cancer Father     brain  tumor  . Diverticulitis Sister   . Diverticulitis Sister      Review of Systems A comprehensive review of systems was negative.  Objective: Vital signs in last 24 hours: Temp:  [98.6 F (37 C)-98.7 F (37.1 C)] 98.6 F (37 C) (02/01 0715) Pulse Rate:  [84-95] 95 (02/01 0715) Resp:  [16-20] 16 (02/01 0715) BP: (135-173)/(77-100) 173/100 mmHg (02/01 0715) SpO2:  [97 %-99 %] 97 % (02/01 0715) Weight:  [82.3 kg (181 lb 7 oz)] 82.3 kg (181 lb 7 oz) (02/01 0715)  EXAM: Patient well-developed well-nourished white female in no acute distress.  Lungs are clear to auscultation , the patient has symmetrical respiratory excursion. Heart has a regular rate and rhythm normal S1 and  S2 no murmur.   Abdomen is soft nontender nondistended bowel sounds are present. Extremity examination shows no clubbing cyanosis or edema. Neurologic examination shows the left quadriceps is 4/5, the remainder the lower extremity strength is 5/5 including the iliopsoas bilaterally, the right quadriceps, as well as the dorsiflexor, EHL, and plantar flexor bilaterally. Sensation is decreased to pinprick in the medial and lateral aspect the left leg, but otherwise intact pinprick in the right leg as well as in the feet bilaterally. Reflexes examination shows left quadriceps is absent, the right quadriceps is 1-2. Gastrocnemius are 1 bilaterally. Toes are downgoing bilaterally. Gait and stance both favor the left lower extremity.   Data Review:CBC    Component Value Date/Time   WBC 8.2 12/06/2015 2347   RBC 4.59 12/06/2015 2347   HGB 13.9 12/06/2015 2347   HCT 41.8 12/06/2015 2347   PLT 315 12/06/2015 2347   MCV 91.1 12/06/2015 2347   MCH 30.3 12/06/2015 2347   MCHC 33.2 12/06/2015 2347   RDW 13.9 12/06/2015 2347   MONOABS 0.6 07/10/2007 1004   EOSABS 0.3 07/10/2007 1004   BASOSABS 0.0 07/10/2007 1004                          BMET    Component Value Date/Time   NA 138 12/06/2015 2347   K 3.9 12/06/2015 2347   CL 105 12/06/2015 2347   CO2 27 12/06/2015 2347   GLUCOSE 105* 12/06/2015 2347   BUN 13 12/06/2015 2347   CREATININE 0.68 12/06/2015 2347   CALCIUM 9.4 12/06/2015 2347   GFRNONAA >60 12/06/2015 2347   GFRAA >60 12/06/2015 2347     Assessment/Plan: Patient with an acute and disabling left lumbar radiculopathy, secondary to a free fragment left L4-5 foraminal disc herniation, but she also has significant stenosis. She is admitted for a left L4-5 lumbar laminotomy and microdiscectomy.  I've discussed with the patient the nature of his condition, the nature the surgical procedure, the typical length of surgery, hospital stay, and overall recuperation. We discussed limitations  postoperatively. I discussed risks of surgery including risks of infection, bleeding, possibly need for transfusion, the risk of nerve root dysfunction with pain, weakness, numbness, or paresthesias, or risk of dural tear and CSF leakage and possible need for further surgery, the risk of recurrent disc herniation and the possible need for further surgery, and the risk of anesthetic complications including myocardial infarction, stroke, pneumonia, and death. Understanding all this the patient does wish to proceed with surgery and is admitted for such.    Hosie Spangle, MD 12/21/2015 7:34 AM

## 2015-12-21 NOTE — OR Nursing (Signed)
FENTANYL 100 MCG GIVEN WITH DEPOMEDROL 80 MG TO SURGICAL SITE BY DR [facility]

## 2015-12-21 NOTE — Anesthesia Procedure Notes (Signed)
Procedure Name: Intubation Date/Time: 12/21/2015 8:17 AM Performed by: Izora Gala Pre-anesthesia Checklist: Emergency Drugs available, Patient identified, Suction available and Timeout performed Patient Re-evaluated:Patient Re-evaluated prior to inductionOxygen Delivery Method: Circle system utilized Preoxygenation: Pre-oxygenation with 100% oxygen Intubation Type: IV induction Ventilation: Mask ventilation without difficulty Laryngoscope Size: Miller and 3 Grade View: Grade I Tube type: Oral Tube size: 7.0 mm Number of attempts: 1 Airway Equipment and Method: Stylet and LTA kit utilized Placement Confirmation: ETT inserted through vocal cords under direct vision,  positive ETCO2 and breath sounds checked- equal and bilateral Secured at: 22 cm Dental Injury: Teeth and Oropharynx as per pre-operative assessment

## 2015-12-22 ENCOUNTER — Encounter (HOSPITAL_COMMUNITY): Payer: Self-pay | Admitting: Neurosurgery

## 2015-12-22 NOTE — Discharge Summary (Signed)
  Physician Discharge Summary  Patient ID: Katelyn Rivera MRN: [id number] DOB/AGE: 63-Mar-1952 63 y.o.  Admit date: 12/21/2015 Discharge date: 12/22/2015  Admission Diagnoses: Lumbar spondylosis and stenosis L4-5  Discharge Diagnoses: Same Active Problems:   HNP (herniated nucleus pulposus), lumbar   Discharged Condition: good  Hospital Course: Patient 65 year old female has had admitted yesterday for decompressive laminectomy and foraminotomies with discectomy patient did very well with recovered in the floor on the floor was angling and voiding spontaneously tolerating regular diet and stable for discharge home.  Consults: Significant Diagnostic Studies: Treatments: L4-5 laminectomy microdiscectomy Discharge Exam: Blood pressure 154/76, pulse 101, temperature 98.3 F (36.8 C), temperature source Oral, resp. rate 20, height 5\' 4"  (1.626 m), weight 82.3 kg (181 lb 7 oz), SpO2 94 %. Strength out of 5 wound clean dry and intact  Disposition: Home     Medication List    TAKE these medications        ALIGN 4 MG Caps  Take 1 capsule by mouth daily.     cetirizine 10 MG tablet  Commonly known as:  ZYRTEC  Take 10 mg by mouth daily.     cholecalciferol 1000 units tablet  Commonly known as:  VITAMIN D  Take 1,000 Units by mouth daily.     diazepam 2 MG tablet  Commonly known as:  VALIUM  Take 1 tablet (2 mg total) by mouth every 8 (eight) hours as needed for anxiety.     Fish Oil 1000 MG Caps  Take 1 capsule by mouth daily.     folic acid A999333 MCG tablet  Commonly known as:  FOLVITE  Take 800 mcg by mouth daily.     Glucosamine 500 MG Caps  Take 1 capsule by mouth daily.     HYDROcodone-acetaminophen 5-325 MG tablet  Commonly known as:  NORCO/VICODIN  Take 1 tablet by mouth every 8 (eight) hours as needed for moderate pain.     HYDROcodone-acetaminophen 5-325 MG tablet  Commonly known as:  NORCO/VICODIN  Take 1-2 tablets by mouth every 4 (four) hours as needed  (mild pain).     Lysine 500 MG Tabs  Take 1 tablet by mouth daily.     vitamin B-12 1000 MCG tablet  Commonly known as:  CYANOCOBALAMIN  Take 1,000 mcg by mouth daily.           Follow-up Information    Follow up with Hosie Spangle, MD.   Specialty:  Neurosurgery   Contact information:   [address] [place] [address] [phone]       Signed: Elaina Hoops 12/22/2015, 6:33 AM

## 2015-12-22 NOTE — Progress Notes (Signed)
Patient ID: Katelyn Rivera, female   DOB: December 25, 1950, 65 y.o.   MRN: [id number] Patient doing well no leg pain  Strength 5 out of 5 wound clean dry and intact  Discharge home

## 2015-12-22 NOTE — Discharge Instructions (Signed)

## 2015-12-22 NOTE — Progress Notes (Signed)
Patient alert and oriented, mae's well, voiding adequate amount of urine, swallowing without difficulty, c/o pain and medication given prior to discharged. Patient discharged home with family. Script and discharged instructions given to patient. Patient and family stated understanding of d/c instructions given and has an appointment with MD.  

## 2016-12-11 ENCOUNTER — Encounter: Payer: Self-pay | Admitting: Obstetrics and Gynecology

## 2017-01-08 ENCOUNTER — Encounter: Payer: Self-pay | Admitting: Obstetrics and Gynecology

## 2017-01-25 ENCOUNTER — Emergency Department: Payer: Self-pay

## 2017-01-25 ENCOUNTER — Encounter: Payer: Self-pay | Admitting: Emergency Medicine

## 2017-01-25 ENCOUNTER — Emergency Department
Admission: EM | Admit: 2017-01-25 | Discharge: 2017-01-25 | Disposition: A | Discharge status: Home / Self Care | Payer: Self-pay | Attending: Student in an Organized Health Care Education/Training Program | Admitting: Student in an Organized Health Care Education/Training Program

## 2017-01-25 DIAGNOSIS — N83202 Unspecified ovarian cyst, left side: Secondary | ICD-10-CM | POA: Insufficient documentation

## 2017-01-25 DIAGNOSIS — Z79899 Other long term (current) drug therapy: Secondary | ICD-10-CM | POA: Insufficient documentation

## 2017-01-25 DIAGNOSIS — R1032 Left lower quadrant pain: Secondary | ICD-10-CM

## 2017-01-25 DIAGNOSIS — R935 Abnormal findings on diagnostic imaging of other abdominal regions, including retroperitoneum: Secondary | ICD-10-CM | POA: Insufficient documentation

## 2017-01-25 LAB — COMPREHENSIVE METABOLIC PANEL
ALK PHOS: 62 U/L (ref 38–126)
ALT: 24 U/L (ref 14–54)
AST: 24 U/L (ref 15–41)
Albumin: 4.3 g/dL (ref 3.5–5.0)
Anion gap: 8 (ref 5–15)
BUN: 15 mg/dL (ref 6–20)
CALCIUM: 9.5 mg/dL (ref 8.9–10.3)
CO2: 26 mmol/L (ref 22–32)
CREATININE: 0.49 mg/dL (ref 0.44–1.00)
Chloride: 103 mmol/L (ref 101–111)
Glucose, Bld: 118 mg/dL — ABNORMAL HIGH (ref 65–99)
Potassium: 4.1 mmol/L (ref 3.5–5.1)
Sodium: 137 mmol/L (ref 135–145)
Total Bilirubin: 0.6 mg/dL (ref 0.3–1.2)
Total Protein: 7.6 g/dL (ref 6.5–8.1)

## 2017-01-25 LAB — URINALYSIS, COMPLETE (UACMP) WITH MICROSCOPIC
BILIRUBIN URINE: NEGATIVE
Bacteria, UA: NONE SEEN
Glucose, UA: NEGATIVE mg/dL
Hgb urine dipstick: NEGATIVE
Ketones, ur: NEGATIVE mg/dL
LEUKOCYTES UA: NEGATIVE
Nitrite: NEGATIVE
PH: 7 (ref 5.0–8.0)
Protein, ur: NEGATIVE mg/dL
RBC / HPF: NONE SEEN RBC/hpf (ref 0–5)
SPECIFIC GRAVITY, URINE: 1.017 (ref 1.005–1.030)
Squamous Epithelial / LPF: NONE SEEN

## 2017-01-25 LAB — CBC
HCT: 40.7 % (ref 35.0–47.0)
Hemoglobin: 13.8 g/dL (ref 12.0–16.0)
MCH: 30.2 pg (ref 26.0–34.0)
MCHC: 33.8 g/dL (ref 32.0–36.0)
MCV: 89.3 fL (ref 80.0–100.0)
PLATELETS: 319 10*3/uL (ref 150–440)
RBC: 4.55 MIL/uL (ref 3.80–5.20)
RDW: 13.5 % (ref 11.5–14.5)
WBC: 8.1 10*3/uL (ref 3.6–11.0)

## 2017-01-25 LAB — LIPASE, BLOOD: Lipase: <10 U/L — ABNORMAL LOW (ref 11–51)

## 2017-01-25 MED ORDER — IOPAMIDOL (ISOVUE-300) INJECTION 61%
30.0000 mL | Freq: Once | INTRAVENOUS | Status: AC | PRN
  Administered 2017-01-25: 30 mL via ORAL

## 2017-01-25 MED ORDER — IOPAMIDOL (ISOVUE-300) INJECTION 61%
100.0000 mL | Freq: Once | INTRAVENOUS | Status: AC | PRN
  Administered 2017-01-25: 100 mL via INTRAVENOUS

## 2017-01-25 MED ORDER — PROMETHAZINE HCL 25 MG/ML IJ SOLN
12.5000 mg | Freq: Once | INTRAMUSCULAR | Status: DC
  Filled 2017-01-25: qty 1

## 2017-01-25 MED ORDER — NAPROXEN 500 MG PO TABS: 500.0000 mg | ORAL_TABLET | Freq: Two times a day (BID) | ORAL | 0 refills | Status: DC

## 2017-01-25 MED ORDER — DICYCLOMINE HCL 10 MG PO CAPS: 10.0000 mg | ORAL_CAPSULE | Freq: Three times a day (TID) | ORAL | 0 refills | Status: DC | PRN

## 2017-01-25 MED ORDER — ONDANSETRON 4 MG PO TBDP: ORAL_TABLET | ORAL | 0 refills | Status: DC

## 2017-01-25 MED ORDER — OXYCODONE-ACETAMINOPHEN 5-325 MG PO TABS
1.0000 | ORAL_TABLET | ORAL | Status: DC | PRN
  Administered 2017-01-25: 1 via ORAL
  Filled 2017-01-25: qty 1

## 2017-01-25 MED ORDER — ONDANSETRON 4 MG PO TBDP
4.0000 mg | ORAL_TABLET | Freq: Once | ORAL | Status: AC
  Administered 2017-01-25: 4 mg via ORAL
  Filled 2017-01-25: qty 1

## 2017-01-25 MED ORDER — PROMETHAZINE HCL 25 MG/ML IJ SOLN
12.5000 mg | Freq: Once | INTRAMUSCULAR | Status: AC
  Administered 2017-01-25: 12.5 mg via INTRAVENOUS
  Filled 2017-01-25: qty 1

## 2017-01-25 MED ORDER — MORPHINE SULFATE (PF) 4 MG/ML IV SOLN
4.0000 mg | INTRAVENOUS | Status: DC | PRN
  Administered 2017-01-25: 4 mg via INTRAVENOUS
  Filled 2017-01-25: qty 1

## 2017-01-25 MED ORDER — SODIUM CHLORIDE 0.9 % IV BOLUS (SEPSIS)
1000.0000 mL | Freq: Once | INTRAVENOUS | Status: AC
  Administered 2017-01-25: 1000 mL via INTRAVENOUS

## 2017-01-25 NOTE — ED Triage Notes (Signed)
Pt sent from [facility] for reports of left lower back that wraps around to abdomen. Pt reports history of diverticulitis. Pt reports burning with urination. Pt reports some nausea but denies vomiting and diarrhea.

## 2017-01-25 NOTE — ED Notes (Signed)
Pt had 1 episode of emesis, see MAR for follow up.

## 2017-01-25 NOTE — ED Provider Notes (Signed)
[facility] Emergency Department Provider Note    First MD Initiated Contact with Patient 01/25/17 1400     (approximate)  I have reviewed the triage vital signs and the nursing notes.   HISTORY  Chief Complaint Abdominal Pain    HPI Katelyn Rivera is a 66 y.o. female history of diverticulitis presents with acute left lower back pain wrapping around to her left lower abdomen for the past 2-3 days. States is been gradually worsening. Denies any diarrhea. No nausea or vomiting. Since the pain is currently 6 out of 10 in severity. Has had some burning with urination but no fevers. States it feels similar to previous diverticulitis.  No pain worsening with eating. Pain is worse on movement. No rashes.   Past Medical History:  Diagnosis Date  . Allergy    allergic rhinitis  . Arthritis   . Diverticulitis    Hx of  . GERD (gastroesophageal reflux disease)   . HNP (herniated nucleus pulposus with myelopathy), thoracic   . UTI (lower urinary tract infection)    Family History  Problem Relation Age of Onset  . Diverticulitis Mother   . Alzheimer's disease Mother   . Cancer Father     brain tumor  . Diverticulitis Sister   . Diverticulitis Sister    Past Surgical History:  Procedure Laterality Date  . COLONOSCOPY    . LUMBAR LAMINECTOMY/DECOMPRESSION MICRODISCECTOMY Left 12/21/2015   Procedure: Left lumbar four-fivelumbar laminotomy and microdiscectomy;  Surgeon: Jovita Gamma, MD;  Location: [place] [facility];  Service: Neurosurgery;  Laterality: Left;  . REFRACTIVE SURGERY    . TUBAL LIGATION     Patient Active Problem List   Diagnosis Date Noted  . HNP (herniated nucleus pulposus), lumbar 12/21/2015  . Stress reaction 11/26/2011  . ACUTE ETHMOIDAL SINUSITIS 08/31/2010  . URINARY FREQUENCY 08/31/2010  . COLD SORE 10/21/2009  . RUQ PAIN 12/03/2008  . UTI 07/22/2008  . BACK PAIN 01/07/2008  . PLANTAR FASCIITIS 01/07/2008  . ALLERGIC RHINITIS  06/18/2007  . OVARIAN CYST 06/18/2007  . DIVERTICULITIS, HX OF 06/18/2007      Prior to Admission medications   Medication Sig Start Date End Date Taking? Authorizing Provider  cetirizine (ZYRTEC) 10 MG tablet Take 10 mg by mouth daily.      Historical Provider, MD  cholecalciferol (VITAMIN D) 1000 units tablet Take 1,000 Units by mouth daily.    Historical Provider, MD  dicyclomine (BENTYL) 10 MG capsule Take 1 capsule (10 mg total) by mouth 3 (three) times daily as needed for spasms. 01/25/17 02/08/17  Merlyn Lot, MD  folic acid (FOLVITE) 517 MCG tablet Take 800 mcg by mouth daily.     Historical Provider, MD  Glucosamine 500 MG CAPS Take 1 capsule by mouth daily.      Historical Provider, MD  HYDROcodone-acetaminophen (NORCO/VICODIN) 5-325 MG tablet Take 1 tablet by mouth every 8 (eight) hours as needed for moderate pain.    Historical Provider, MD  HYDROcodone-acetaminophen (NORCO/VICODIN) 5-325 MG tablet Take 1-2 tablets by mouth every 4 (four) hours as needed (mild pain). 12/21/15   Jovita Gamma, MD  Lysine 500 MG TABS Take 1 tablet by mouth daily.    Historical Provider, MD  naproxen (NAPROSYN) 500 MG tablet Take 1 tablet (500 mg total) by mouth 2 (two) times daily with a meal. 01/25/17 01/25/18  Merlyn Lot, MD  Omega-3 Fatty Acids (FISH OIL) 1000 MG CAPS Take 1 capsule by mouth daily.    Historical Provider,  MD  Probiotic Product (ALIGN) 4 MG CAPS Take 1 capsule by mouth daily.    Historical Provider, MD  vitamin B-12 (CYANOCOBALAMIN) 1000 MCG tablet Take 1,000 mcg by mouth daily.    Historical Provider, MD    Allergies Tetracycline    Social History Social History  Substance Use Topics  . Smoking status: Never Smoker  . Smokeless tobacco: Never Used  . Alcohol use No    Review of Systems Patient denies headaches, rhinorrhea, blurry vision, numbness, shortness of breath, chest pain, edema, cough, abdominal pain, nausea, vomiting, diarrhea, dysuria, fevers, rashes or  hallucinations unless otherwise stated above in HPI. ____________________________________________   PHYSICAL EXAM:  VITAL SIGNS: Vitals:   01/25/17 1220 01/25/17 1533  BP: (!) 198/81 (!) 166/71  Pulse: 77 61  Resp: 18 18  Temp: 98 F (36.7 C)     Constitutional: Alert and oriented. Well appearing and in no acute distress. Eyes: Conjunctivae are normal. PERRL. EOMI. Head: Atraumatic. Nose: No congestion/rhinnorhea. Mouth/Throat: Mucous membranes are moist.  Oropharynx non-erythematous. Neck: No stridor. Painless ROM. No cervical spine tenderness to palpation Hematological/Lymphatic/Immunilogical: No cervical lymphadenopathy. Cardiovascular: Normal rate, regular rhythm. Grossly normal heart sounds.  Good peripheral circulation. Respiratory: Normal respiratory effort.  No retractions. Lungs CTAB. Gastrointestinal: Soft with mild ttp in llq. No distention. No abdominal bruits. No CVA tenderness.  Musculoskeletal: No lower extremity tenderness nor edema.  No joint effusions. Neurologic:  Normal speech and language. No gross focal neurologic deficits are appreciated. No gait instability. Skin:  Skin is warm, dry and intact. No rash noted. Psychiatric: Mood and affect are normal. Speech and behavior are normal.  ____________________________________________   LABS (all labs ordered are listed, but only abnormal results are displayed)  Results for orders placed or performed during the hospital encounter of 01/25/17 (from the past 24 hour(s))  Lipase, blood     Status: Abnormal   Collection Time: 01/25/17 12:20 PM  Result Value Ref Range   Lipase <10 (L) 11 - 51 U/L  Comprehensive metabolic panel     Status: Abnormal   Collection Time: 01/25/17 12:20 PM  Result Value Ref Range   Sodium 137 135 - 145 mmol/L   Potassium 4.1 3.5 - 5.1 mmol/L   Chloride 103 101 - 111 mmol/L   CO2 26 22 - 32 mmol/L   Glucose, Bld 118 (H) 65 - 99 mg/dL   BUN 15 6 - 20 mg/dL   Creatinine, Ser 0.49  0.44 - 1.00 mg/dL   Calcium 9.5 8.9 - 10.3 mg/dL   Total Protein 7.6 6.5 - 8.1 g/dL   Albumin 4.3 3.5 - 5.0 g/dL   AST 24 15 - 41 U/L   ALT 24 14 - 54 U/L   Alkaline Phosphatase 62 38 - 126 U/L   Total Bilirubin 0.6 0.3 - 1.2 mg/dL   GFR calc non Af Amer >60 >60 mL/min   GFR calc Af Amer >60 >60 mL/min   Anion gap 8 5 - 15  CBC     Status: None   Collection Time: 01/25/17 12:20 PM  Result Value Ref Range   WBC 8.1 3.6 - 11.0 K/uL   RBC 4.55 3.80 - 5.20 MIL/uL   Hemoglobin 13.8 12.0 - 16.0 g/dL   HCT 40.7 35.0 - 47.0 %   MCV 89.3 80.0 - 100.0 fL   MCH 30.2 26.0 - 34.0 pg   MCHC 33.8 32.0 - 36.0 g/dL   RDW 13.5 11.5 - 14.5 %   Platelets 319  150 - 440 K/uL  Urinalysis, Complete w Microscopic     Status: Abnormal   Collection Time: 01/25/17 12:23 PM  Result Value Ref Range   Color, Urine YELLOW (A) YELLOW   APPearance HAZY (A) CLEAR   Specific Gravity, Urine 1.017 1.005 - 1.030   pH 7.0 5.0 - 8.0   Glucose, UA NEGATIVE NEGATIVE mg/dL   Hgb urine dipstick NEGATIVE NEGATIVE   Bilirubin Urine NEGATIVE NEGATIVE   Ketones, ur NEGATIVE NEGATIVE mg/dL   Protein, ur NEGATIVE NEGATIVE mg/dL   Nitrite NEGATIVE NEGATIVE   Leukocytes, UA NEGATIVE NEGATIVE   RBC / HPF NONE SEEN 0 - 5 RBC/hpf   WBC, UA 0-5 0 - 5 WBC/hpf   Bacteria, UA NONE SEEN NONE SEEN   Squamous Epithelial / LPF NONE SEEN NONE SEEN   Mucous PRESENT    Amorphous Crystal PRESENT    ____________________________________________  ____________________________________________  RADIOLOGY  I personally reviewed all radiographic images ordered to evaluate for the above acute complaints and reviewed radiology reports and findings.  These findings were personally discussed with the patient.  Please see medical record for radiology report.  ____________________________________________   PROCEDURES  Procedure(s) performed:  Procedures    Critical Care performed:  no ____________________________________________   INITIAL IMPRESSION / ASSESSMENT AND PLAN / ED COURSE  Pertinent labs & imaging results that were available during my care of the patient were reviewed by me and considered in my medical decision making (see chart for details).  DDX: diverticulitis, stone, cyst, msk strain  Katelyn Rivera is a 66 y.o. who presents to the ED with left lower quadrant pain as described above. Afebrile hemodynamically stable. Blood work is reassuring. CT imaging shows evidence of left ovarian cyst without any significant edema or free fluid. No evidence of diverticulitis, abscess or perforation.  Not consistent wiht stone or pyelo. Noted SMA on CT scan. History less consistent with any symptomatic ischemia.  No edema on CT scan she just any ischemia at this time. I spoke with Dr. Leotis Pain, of vascular surgery, who recommends starting daily aspirin and follow-up in clinic.  Patient was able to tolerate PO and was able to ambulate with a steady gait.  Have discussed with the patient and available family all diagnostics and treatments performed thus far and all questions were answered to the best of my ability. The patient demonstrates understanding and agreement with plan.       ____________________________________________   FINAL CLINICAL IMPRESSION(S) / ED DIAGNOSES  Final diagnoses:  Left lower quadrant pain  Abnormal CT scan, pelvis  Cyst of left ovary      NEW MEDICATIONS STARTED DURING THIS VISIT:  New Prescriptions   DICYCLOMINE (BENTYL) 10 MG CAPSULE    Take 1 capsule (10 mg total) by mouth 3 (three) times daily as needed for spasms.   NAPROXEN (NAPROSYN) 500 MG TABLET    Take 1 tablet (500 mg total) by mouth 2 (two) times daily with a meal.     Note:  This document was prepared using Dragon voice recognition software and may include unintentional dictation errors.    Merlyn Lot, MD 01/25/17 [phone]

## 2017-01-25 NOTE — ED Notes (Signed)
Pt reports still feeling nauseous, pt has emesis bag by her side. MD notified.

## 2017-01-25 NOTE — Discharge Instructions (Signed)

## 2017-01-25 NOTE — ED Notes (Addendum)
Secretary asked to call radiology for copy of scans per DC instructions

## 2017-01-25 NOTE — ED Notes (Signed)

## 2017-01-29 ENCOUNTER — Ambulatory Visit (INDEPENDENT_AMBULATORY_CARE_PROVIDER_SITE_OTHER): Payer: Self-pay

## 2017-01-29 ENCOUNTER — Other Ambulatory Visit: Payer: Self-pay | Admitting: Obstetrics and Gynecology

## 2017-01-29 ENCOUNTER — Encounter: Payer: Self-pay | Admitting: Obstetrics and Gynecology

## 2017-01-29 ENCOUNTER — Ambulatory Visit (INDEPENDENT_AMBULATORY_CARE_PROVIDER_SITE_OTHER): Payer: Self-pay | Admitting: Obstetrics and Gynecology

## 2017-01-29 VITALS — BP 150/84 | HR 72 | Ht 64.0 in | Wt 177.1 lb

## 2017-01-29 DIAGNOSIS — R102 Pelvic and perineal pain: Secondary | ICD-10-CM

## 2017-01-29 DIAGNOSIS — N83202 Unspecified ovarian cyst, left side: Secondary | ICD-10-CM

## 2017-01-29 NOTE — Progress Notes (Signed)
HPI:      [name] is a 66 y.o. G2P2003 who LMP was No LMP recorded. Patient is postmenopausal.  Subjective:   She presents today  With complaint of lower pelvic pain. She began having some discomfort last week and this gradually became worse over the weekend. She has a history of diverticular disease and thought that this may be the issue. She called her gastroenterologist but was unable to get an appointment. She went to urgent care but was transferred to the emergency department. She underwent abdominal pelvic CT which revealed a 7-1/2 cm ovarian cyst. Cyst was not characterized by CT. She was given NSAID S for pain relief. She follows up here today. She reports that she remains uncomfortable and cannot sit still without uncomfortable pelvic pain. She reports no vaginal bleeding.    Hx: The following portions of the patient's history were reviewed and updated as appropriate:              She  has a past medical history of Allergy; Arthritis; Diverticulitis; GERD (gastroesophageal reflux disease); HNP (herniated nucleus pulposus with myelopathy), thoracic; and UTI (lower urinary tract infection). She  does not have any pertinent problems on file. She  has a past surgical history that includes Tubal ligation; Refractive surgery; Colonoscopy; and Lumbar laminectomy/decompression microdiscectomy (Left, 12/21/2015). Her family history includes Alzheimer's disease in her mother; Cancer in her father; Diverticulitis in her mother, sister, and sister. She  reports that she has never smoked. She has never used smokeless tobacco. She reports that she does not drink alcohol or use drugs. Current Outpatient Prescriptions on File Prior to Visit  Medication Sig Dispense Refill  . cetirizine (ZYRTEC) 10 MG tablet Take 10 mg by mouth daily.      . cholecalciferol (VITAMIN D) 1000 units tablet Take 1,000 Units by mouth daily.    Marland Kitchen dicyclomine (BENTYL) 10 MG capsule Take 1 capsule (10 mg total) by mouth  3 (three) times daily as needed for spasms. 16 capsule 0  . folic acid (FOLVITE) 301 MCG tablet Take 800 mcg by mouth daily.     . Glucosamine 500 MG CAPS Take 1 capsule by mouth daily.      Marland Kitchen HYDROcodone-acetaminophen (NORCO/VICODIN) 5-325 MG tablet Take 1 tablet by mouth every 8 (eight) hours as needed for moderate pain.    Marland Kitchen HYDROcodone-acetaminophen (NORCO/VICODIN) 5-325 MG tablet Take 1-2 tablets by mouth every 4 (four) hours as needed (mild pain). 50 tablet 0  . Lysine 500 MG TABS Take 1 tablet by mouth daily.    . naproxen (NAPROSYN) 500 MG tablet Take 1 tablet (500 mg total) by mouth 2 (two) times daily with a meal. 20 tablet 0  . Omega-3 Fatty Acids (FISH OIL) 1000 MG CAPS Take 1 capsule by mouth daily.    . Probiotic Product (ALIGN) 4 MG CAPS Take 1 capsule by mouth daily.    . vitamin B-12 (CYANOCOBALAMIN) 1000 MCG tablet Take 1,000 mcg by mouth daily.    . ondansetron (ZOFRAN ODT) 4 MG disintegrating tablet 4mg  ODT q6 hours prn nausea/vomit (Patient not taking: Reported on 01/29/2017) 10 tablet 0   No current facility-administered medications on file prior to visit.          Review of Systems:  Review of Systems  Constitutional: Denied constitutional symptoms, night sweats, recent illness, fatigue, fever, insomnia and weight loss.  Eyes: Denied eye symptoms, eye pain, photophobia, vision change and visual disturbance.  Ears/Nose/Throat/Neck: Denied ear, nose, throat  or neck symptoms, hearing loss, nasal discharge, sinus congestion and sore throat.  Cardiovascular: Denied cardiovascular symptoms, arrhythmia, chest pain/pressure, edema, exercise intolerance, orthopnea and palpitations.  Respiratory: Denied pulmonary symptoms, asthma, pleuritic pain, productive sputum, cough, dyspnea and wheezing.  Gastrointestinal: Denied, gastro-esophageal reflux, melena, nausea and vomiting.  Genitourinary: See HPI for additional information.  Musculoskeletal: Denied musculoskeletal symptoms,  stiffness, swelling, muscle weakness and myalgia.  Dermatologic: Denied dermatology symptoms, rash and scar.  Neurologic: Denied neurology symptoms, dizziness, headache, neck pain and syncope.  Psychiatric: Denied psychiatric symptoms, anxiety and depression.  Endocrine: Denied endocrine symptoms including hot flashes and night sweats.   Meds:   Current Outpatient Prescriptions on File Prior to Visit  Medication Sig Dispense Refill  . cetirizine (ZYRTEC) 10 MG tablet Take 10 mg by mouth daily.      . cholecalciferol (VITAMIN D) 1000 units tablet Take 1,000 Units by mouth daily.    Marland Kitchen dicyclomine (BENTYL) 10 MG capsule Take 1 capsule (10 mg total) by mouth 3 (three) times daily as needed for spasms. 16 capsule 0  . folic acid (FOLVITE) 124 MCG tablet Take 800 mcg by mouth daily.     . Glucosamine 500 MG CAPS Take 1 capsule by mouth daily.      Marland Kitchen HYDROcodone-acetaminophen (NORCO/VICODIN) 5-325 MG tablet Take 1 tablet by mouth every 8 (eight) hours as needed for moderate pain.    Marland Kitchen HYDROcodone-acetaminophen (NORCO/VICODIN) 5-325 MG tablet Take 1-2 tablets by mouth every 4 (four) hours as needed (mild pain). 50 tablet 0  . Lysine 500 MG TABS Take 1 tablet by mouth daily.    . naproxen (NAPROSYN) 500 MG tablet Take 1 tablet (500 mg total) by mouth 2 (two) times daily with a meal. 20 tablet 0  . Omega-3 Fatty Acids (FISH OIL) 1000 MG CAPS Take 1 capsule by mouth daily.    . Probiotic Product (ALIGN) 4 MG CAPS Take 1 capsule by mouth daily.    . vitamin B-12 (CYANOCOBALAMIN) 1000 MCG tablet Take 1,000 mcg by mouth daily.    . ondansetron (ZOFRAN ODT) 4 MG disintegrating tablet 4mg  ODT q6 hours prn nausea/vomit (Patient not taking: Reported on 01/29/2017) 10 tablet 0   No current facility-administered medications on file prior to visit.     Objective:     Vitals:   01/29/17 1116  BP: (!) 150/84  Pulse: 72              CT results reviewed directly with the patient.  Abd:  Soft, tender in  left lower quadrant to palpation. No masses palpated. No rebound.  Pelvic exam deferred at this time.  Assessment:    G2P2003 Patient Active Problem List   Diagnosis Date Noted  . HNP (herniated nucleus pulposus), lumbar 12/21/2015  . Stress reaction 11/26/2011  . ACUTE ETHMOIDAL SINUSITIS 08/31/2010  . URINARY FREQUENCY 08/31/2010  . COLD SORE 10/21/2009  . RUQ PAIN 12/03/2008  . UTI 07/22/2008  . BACK PAIN 01/07/2008  . PLANTAR FASCIITIS 01/07/2008  . ALLERGIC RHINITIS 06/18/2007  . OVARIAN CYST 06/18/2007  . DIVERTICULITIS, HX OF 06/18/2007     1. Pelvic pain in female   2. Ovarian cyst, left        Plan:            1.  Ultrasound for characterization of the left ovarian cyst-Doppler flow.  2.  Suspect benign cyst at this time. If ultrasound consistent with benign cyst consider surgery based on patient's pain level. If cyst appears  more consistent with malignancy consider immediate referral.  3.  Patient offered narcotics for pain relief and she declined.    Meds ordered this encounter  Medications  . multivitamin-lutein (OCUVITE-LUTEIN) CAPS capsule    Sig: Take 1 capsule by mouth daily.  Marland Kitchen aspirin 81 MG chewable tablet    Sig: Chew by mouth daily.  . valACYclovir (VALTREX) 500 MG tablet    Sig: Take 2 tablets by mouth at onset of cold sore then 2 tablets 12 hrs later.        F/U  Return in about 1 day (around 01/30/2017).  Finis Bud, M.D. 01/29/2017 11:50 AM

## 2017-01-30 ENCOUNTER — Ambulatory Visit (INDEPENDENT_AMBULATORY_CARE_PROVIDER_SITE_OTHER): Payer: Self-pay | Admitting: Obstetrics and Gynecology

## 2017-01-30 ENCOUNTER — Encounter
Admission: RE | Admit: 2017-01-30 | Discharge: 2017-01-30 | Disposition: A | Discharge status: Home / Self Care | Payer: Self-pay | Source: Ambulatory Visit | Attending: Obstetrics and Gynecology | Admitting: Obstetrics and Gynecology

## 2017-01-30 ENCOUNTER — Encounter: Payer: Self-pay | Admitting: Obstetrics and Gynecology

## 2017-01-30 VITALS — BP 154/73 | HR 65 | Wt 178.5 lb

## 2017-01-30 DIAGNOSIS — R102 Pelvic and perineal pain: Secondary | ICD-10-CM

## 2017-01-30 DIAGNOSIS — J0120 Acute ethmoidal sinusitis, unspecified: Secondary | ICD-10-CM | POA: Insufficient documentation

## 2017-01-30 DIAGNOSIS — R350 Frequency of micturition: Secondary | ICD-10-CM | POA: Insufficient documentation

## 2017-01-30 DIAGNOSIS — B009 Herpesviral infection, unspecified: Secondary | ICD-10-CM | POA: Insufficient documentation

## 2017-01-30 DIAGNOSIS — Z01818 Encounter for other preprocedural examination: Secondary | ICD-10-CM

## 2017-01-30 DIAGNOSIS — Z01812 Encounter for preprocedural laboratory examination: Secondary | ICD-10-CM | POA: Insufficient documentation

## 2017-01-30 DIAGNOSIS — N83202 Unspecified ovarian cyst, left side: Secondary | ICD-10-CM

## 2017-01-30 DIAGNOSIS — R1011 Right upper quadrant pain: Secondary | ICD-10-CM | POA: Insufficient documentation

## 2017-01-30 DIAGNOSIS — M549 Dorsalgia, unspecified: Secondary | ICD-10-CM | POA: Insufficient documentation

## 2017-01-30 DIAGNOSIS — N390 Urinary tract infection, site not specified: Secondary | ICD-10-CM | POA: Insufficient documentation

## 2017-01-30 DIAGNOSIS — N83209 Unspecified ovarian cyst, unspecified side: Secondary | ICD-10-CM | POA: Insufficient documentation

## 2017-01-30 DIAGNOSIS — Z8719 Personal history of other diseases of the digestive system: Secondary | ICD-10-CM | POA: Insufficient documentation

## 2017-01-30 DIAGNOSIS — M722 Plantar fascial fibromatosis: Secondary | ICD-10-CM | POA: Insufficient documentation

## 2017-01-30 DIAGNOSIS — M5126 Other intervertebral disc displacement, lumbar region: Secondary | ICD-10-CM | POA: Insufficient documentation

## 2017-01-30 DIAGNOSIS — F430 Acute stress reaction: Secondary | ICD-10-CM | POA: Insufficient documentation

## 2017-01-30 LAB — TYPE AND SCREEN
ABO/RH(D): O POS
ANTIBODY SCREEN: NEGATIVE

## 2017-01-30 NOTE — Addendum Note (Signed)
Addended by: Finis Bud on: 01/30/2017 11:19 AM   Modules accepted: Orders, SmartSet

## 2017-01-30 NOTE — H&P (Signed)
PRE-OPERATIVE HISTORY AND PHYSICAL EXAM  PCP:  No PCP Per Patient Subjective:   HPI:  Katelyn Rivera is a 66 y.o. G2P2003.  No LMP recorded. Patient is postmenopausal.  She presents today for a pre-op discussion and PE.  She has the following symptoms:  Continued pelvic pain which is disabling. Ultrasound revealing large left cystic mass likely consistent with ovarian cyst   Review of Systems:   Constitutional: Denied constitutional symptoms, night sweats, recent illness, fatigue, fever, insomnia and weight loss.  Eyes: Denied eye symptoms, eye pain, photophobia, vision change and visual disturbance.  Ears/Nose/Throat/Neck: Denied ear, nose, throat or neck symptoms, hearing loss, nasal discharge, sinus congestion and sore throat.  Cardiovascular: Denied cardiovascular symptoms, arrhythmia, chest pain/pressure, edema, exercise intolerance, orthopnea and palpitations.  Respiratory: Denied pulmonary symptoms, asthma, pleuritic pain, productive sputum, cough, dyspnea and wheezing.  Gastrointestinal: Denied, gastro-esophageal reflux, melena, nausea and vomiting.  Genitourinary: See HPI for additional information.  Musculoskeletal: Denied musculoskeletal symptoms, stiffness, swelling, muscle weakness and myalgia.  Dermatologic: Denied dermatology symptoms, rash and scar.  Neurologic: Denied neurology symptoms, dizziness, headache, neck pain and syncope.  Psychiatric: Denied psychiatric symptoms, anxiety and depression.  Endocrine: Denied endocrine symptoms including hot flashes and night sweats.   OB History  Gravida Para Term Preterm AB Living  2 2 2     3   SAB TAB Ectopic Multiple Live Births        1 3    # Outcome Date GA Lbr Len/2nd Weight Sex Delivery Anes PTL Lv  2A Term 36    M Vag-Spont   LIV  2B Term 65    M Vag-Spont   LIV  1 Term 73    F Vag-Spont   LIV      Past Medical History:  Diagnosis Date  . Allergy    allergic rhinitis  . Arthritis   .  Diverticulitis    Hx of  . GERD (gastroesophageal reflux disease)   . HNP (herniated nucleus pulposus with myelopathy), thoracic   . UTI (lower urinary tract infection)     Past Surgical History:  Procedure Laterality Date  . COLONOSCOPY    . LUMBAR LAMINECTOMY/DECOMPRESSION MICRODISCECTOMY Left 12/21/2015   Procedure: Left lumbar four-fivelumbar laminotomy and microdiscectomy;  Surgeon: Jovita Gamma, MD;  Location: [place] [facility];  Service: Neurosurgery;  Laterality: Left;  . REFRACTIVE SURGERY    . TUBAL LIGATION        SOCIAL HISTORY: History  Smoking Status  . Never Smoker  Smokeless Tobacco  . Never Used   History  Alcohol Use No   History  Drug Use No    Family History  Problem Relation Age of Onset  . Diverticulitis Mother   . Alzheimer's disease Mother   . Cancer Father     brain tumor  . Diverticulitis Sister   . Diverticulitis Sister     ALLERGIES:  Tetracycline  MEDS:   Current Outpatient Prescriptions on File Prior to Visit  Medication Sig Dispense Refill  . aspirin 81 MG chewable tablet Chew by mouth daily.    . cetirizine (ZYRTEC) 10 MG tablet Take 10 mg by mouth daily.      . cholecalciferol (VITAMIN D) 1000 units tablet Take 1,000 Units by mouth daily.    Marland Kitchen dicyclomine (BENTYL) 10 MG capsule Take 1 capsule (10 mg total) by mouth 3 (three) times daily as needed for spasms. 16 capsule 0  . folic acid (FOLVITE) 166 MCG tablet  Take 800 mcg by mouth daily.     . Glucosamine 500 MG CAPS Take 1 capsule by mouth daily.      Marland Kitchen HYDROcodone-acetaminophen (NORCO/VICODIN) 5-325 MG tablet Take 1 tablet by mouth every 8 (eight) hours as needed for moderate pain.    Marland Kitchen HYDROcodone-acetaminophen (NORCO/VICODIN) 5-325 MG tablet Take 1-2 tablets by mouth every 4 (four) hours as needed (mild pain). 50 tablet 0  . Lysine 500 MG TABS Take 1 tablet by mouth daily.    . multivitamin-lutein (OCUVITE-LUTEIN) CAPS capsule Take 1 capsule by mouth daily.    . naproxen  (NAPROSYN) 500 MG tablet Take 1 tablet (500 mg total) by mouth 2 (two) times daily with a meal. 20 tablet 0  . Omega-3 Fatty Acids (FISH OIL) 1000 MG CAPS Take 1 capsule by mouth daily.    . ondansetron (ZOFRAN ODT) 4 MG disintegrating tablet 4mg  ODT q6 hours prn nausea/vomit 10 tablet 0  . Probiotic Product (ALIGN) 4 MG CAPS Take 1 capsule by mouth daily.    . valACYclovir (VALTREX) 500 MG tablet Take 2 tablets by mouth at onset of cold sore then 2 tablets 12 hrs later.    . vitamin B-12 (CYANOCOBALAMIN) 1000 MCG tablet Take 1,000 mcg by mouth daily.     No current facility-administered medications on file prior to visit.     No orders of the defined types were placed in this encounter.    Physical examination BP (!) 154/73   Pulse 65   Wt 178 lb 8 oz (81 kg)   BMI 30.64 kg/m   General NAD, Conversant  HEENT Atraumatic; Op clear with mmm.  Normo-cephalic. Pupils reactive. Anicteric sclerae  Thyroid/Neck Smooth without nodularity or enlargement. Normal ROM.  Neck Supple.  Skin No rashes, lesions or ulceration. Normal palpated skin turgor. No nodularity.  Breasts: No masses or discharge.  Symmetric.  No axillary adenopathy.  Lungs: Clear to auscultation.No rales or wheezes. Normal Respiratory effort, no retractions.  Heart: NSR.  No murmurs or rubs appreciated. No periferal edema  Abdomen: Soft.  Non-tender.  No masses.  No HSM. No hernia  Extremities: Moves all appropriately.  Normal ROM for age. No lymphadenopathy.  Neuro: Oriented to PPT.  Normal mood. Normal affect.     Pelvic:   Vulva: Normal appearance.  No lesions.  Vagina:   Support:   Urethra   Meatus   Cervix:   Anus: Normal exam.  No lesions.  Perineum: Normal exam.  No lesions.        Bimanual   Uterus:   Adnexae:   Cul-de-sac:   Patient declined examination because of her pelvic pain especially in light of her inability to have a vaginal ultrasound yesterday.  Assessment:   G2P2003 Patient Active Problem  List   Diagnosis Date Noted  . HNP (herniated nucleus pulposus), lumbar 12/21/2015  . Stress reaction 11/26/2011  . ACUTE ETHMOIDAL SINUSITIS 08/31/2010  . URINARY FREQUENCY 08/31/2010  . COLD SORE 10/21/2009  . RUQ PAIN 12/03/2008  . UTI 07/22/2008  . BACK PAIN 01/07/2008  . PLANTAR FASCIITIS 01/07/2008  . ALLERGIC RHINITIS 06/18/2007  . OVARIAN CYST 06/18/2007  . DIVERTICULITIS, HX OF 06/18/2007    1. Pelvic pain in female   2. Ovarian cyst, left   3. Preop examination      Plan:   1.  Laparoscopic oophorectomy for ovarian cyst.  Pre-op discussions regarding Risks and Benefits of her scheduled surgery. Please see office notes.  Finis Bud, M.D. 01/30/2017  11:17 AM

## 2017-01-30 NOTE — Patient Instructions (Signed)
Your procedure is scheduled on: January 31, 2017 (Thursday) Report to Same Day Surgery 2nd floor medical mall [facility] Entrance-take elevator on left to 2nd floor.  Check in with surgery information desk.) To find out your arrival time please call [phone] between 1PM - 3PM on Today, January 30, 2017  Remember: Instructions that are not followed completely may result in serious medical risk, up to and including death, or upon the discretion of your surgeon and anesthesiologist your surgery may need to be rescheduled.    _x___ 1. Do not eat food or drink liquids after midnight. No gum chewing or                              hard candies.     __x__ 2. No Alcohol for 24 hours before or after surgery.   __x__3. No Smoking for 24 prior to surgery.   ____  4. Bring all medications with you on the day of surgery if instructed.    __x__ 5. Notify your doctor if there is any change in your medical condition     (cold, fever, infections).     Do not wear jewelry, make-up, hairpins, clips or nail polish.  Do not wear lotions, powders, or perfumes. You may wear deodorant.  Do not shave 48 hours prior to surgery. Men may shave face and neck.  Do not bring valuables to the hospital.    [facility] is not responsible for any belongings or valuables.               Contacts, dentures or bridgework may not be worn into surgery.  Leave your suitcase in the car. After surgery it may be brought to your room.  For patients admitted to the hospital, discharge time is determined by your                       treatment team.   Patients discharged the day of surgery will not be allowed to drive home.  You will need someone to drive you home and stay with you the night of your procedure.    Please read over the following fact sheets that you were given:   [facility] Preparing for Surgery and or MRSA Information   __ Take anti-hypertensive (unless it includes a diuretic), cardiac, seizure, asthma,      anti-reflux and psychiatric medicines. These include:  1.   2.  3.  4.  5.  6.  ____Fleets enema or Magnesium Citrate as directed.   _x___ Use CHG Soap or sage wipes as directed on instruction sheet   ____ Use inhalers on the day of surgery and bring to hospital day of surgery  ____ Stop Metformin and Janumet 2 days prior to surgery.    ____ Take 1/2 of usual insulin dose the night before surgery and none on the morning surgery      _x___ Follow recommendations from Cardiologist, Pulmonologist or PCP regarding          stopping Aspirin, Coumadin, Pllavix ,Eliquis, Effient, or Pradaxa, and Pletal. ( STOP ASPIRIN UNTIL AFTER SURGERY)  X____Stop Anti-inflammatories such as Advil, Aleve, Ibuprofen, Motrin, Naproxen, Naprosyn, Goodies powders or aspirin products. OK to take Tylenol    _x___ Stop supplements until after surgery.  But may continue Vitamin D, Vitamin B, and multivitamin.   (STOP FISH OIL, GLUCOSAMINE, LYSINE AND LUTEIN NOW)  _x___ Bring C-Pap to the hospital.

## 2017-01-30 NOTE — Progress Notes (Signed)
HPI:      [name] is a 66 y.o. G2P2003 who LMP was No LMP recorded. Patient is postmenopausal.  Subjective:   She presents today  Continuing to complain of left-sided pelvic pain which is moderately disabling. She is not able to work effectively. She is taking narcotics for pain relief. She denies vaginal bleeding. She originally had problems with constipation but has had bowel movements the last 2 days.    Hx: The following portions of the patient's history were reviewed and updated as appropriate:              She  has a past medical history of Allergy; Arthritis; Diverticulitis; GERD (gastroesophageal reflux disease); HNP (herniated nucleus pulposus with myelopathy), thoracic; and UTI (lower urinary tract infection). She  does not have any pertinent problems on file. She  has a past surgical history that includes Tubal ligation; Refractive surgery; Colonoscopy; and Lumbar laminectomy/decompression microdiscectomy (Left, 12/21/2015). Her family history includes Alzheimer's disease in her mother; Cancer in her father; Diverticulitis in her mother, sister, and sister. She  reports that she has never smoked. She has never used smokeless tobacco. She reports that she does not drink alcohol or use drugs. Current Outpatient Prescriptions on File Prior to Visit  Medication Sig Dispense Refill  . aspirin 81 MG chewable tablet Chew by mouth daily.    . cetirizine (ZYRTEC) 10 MG tablet Take 10 mg by mouth daily.      . cholecalciferol (VITAMIN D) 1000 units tablet Take 1,000 Units by mouth daily.    Marland Kitchen dicyclomine (BENTYL) 10 MG capsule Take 1 capsule (10 mg total) by mouth 3 (three) times daily as needed for spasms. 16 capsule 0  . folic acid (FOLVITE) 836 MCG tablet Take 800 mcg by mouth daily.     . Glucosamine 500 MG CAPS Take 1 capsule by mouth daily.      Marland Kitchen HYDROcodone-acetaminophen (NORCO/VICODIN) 5-325 MG tablet Take 1 tablet by mouth every 8 (eight) hours as needed for moderate pain.     Marland Kitchen HYDROcodone-acetaminophen (NORCO/VICODIN) 5-325 MG tablet Take 1-2 tablets by mouth every 4 (four) hours as needed (mild pain). 50 tablet 0  . Lysine 500 MG TABS Take 1 tablet by mouth daily.    . multivitamin-lutein (OCUVITE-LUTEIN) CAPS capsule Take 1 capsule by mouth daily.    . naproxen (NAPROSYN) 500 MG tablet Take 1 tablet (500 mg total) by mouth 2 (two) times daily with a meal. 20 tablet 0  . Omega-3 Fatty Acids (FISH OIL) 1000 MG CAPS Take 1 capsule by mouth daily.    . ondansetron (ZOFRAN ODT) 4 MG disintegrating tablet 4mg  ODT q6 hours prn nausea/vomit 10 tablet 0  . Probiotic Product (ALIGN) 4 MG CAPS Take 1 capsule by mouth daily.    . valACYclovir (VALTREX) 500 MG tablet Take 2 tablets by mouth at onset of cold sore then 2 tablets 12 hrs later.    . vitamin B-12 (CYANOCOBALAMIN) 1000 MCG tablet Take 1,000 mcg by mouth daily.     No current facility-administered medications on file prior to visit.          Review of Systems:  Review of Systems  Constitutional: Denied constitutional symptoms, night sweats, recent illness, fatigue, fever, insomnia and weight loss.  Eyes: Denied eye symptoms, eye pain, photophobia, vision change and visual disturbance.  Ears/Nose/Throat/Neck: Denied ear, nose, throat or neck symptoms, hearing loss, nasal discharge, sinus congestion and sore throat.  Cardiovascular: Denied cardiovascular symptoms, arrhythmia, chest  pain/pressure, edema, exercise intolerance, orthopnea and palpitations.  Respiratory: Denied pulmonary symptoms, asthma, pleuritic pain, productive sputum, cough, dyspnea and wheezing.  Gastrointestinal: Denied, gastro-esophageal reflux, melena, nausea and vomiting.  Genitourinary: See HPI for additional information.  Musculoskeletal: Denied musculoskeletal symptoms, stiffness, swelling, muscle weakness and myalgia.  Dermatologic: Denied dermatology symptoms, rash and scar.  Neurologic: Denied neurology symptoms, dizziness, headache,  neck pain and syncope.  Psychiatric: Denied psychiatric symptoms, anxiety and depression.  Endocrine: Denied endocrine symptoms including hot flashes and night sweats.   Meds:   Current Outpatient Prescriptions on File Prior to Visit  Medication Sig Dispense Refill  . aspirin 81 MG chewable tablet Chew by mouth daily.    . cetirizine (ZYRTEC) 10 MG tablet Take 10 mg by mouth daily.      . cholecalciferol (VITAMIN D) 1000 units tablet Take 1,000 Units by mouth daily.    Marland Kitchen dicyclomine (BENTYL) 10 MG capsule Take 1 capsule (10 mg total) by mouth 3 (three) times daily as needed for spasms. 16 capsule 0  . folic acid (FOLVITE) 299 MCG tablet Take 800 mcg by mouth daily.     . Glucosamine 500 MG CAPS Take 1 capsule by mouth daily.      Marland Kitchen HYDROcodone-acetaminophen (NORCO/VICODIN) 5-325 MG tablet Take 1 tablet by mouth every 8 (eight) hours as needed for moderate pain.    Marland Kitchen HYDROcodone-acetaminophen (NORCO/VICODIN) 5-325 MG tablet Take 1-2 tablets by mouth every 4 (four) hours as needed (mild pain). 50 tablet 0  . Lysine 500 MG TABS Take 1 tablet by mouth daily.    . multivitamin-lutein (OCUVITE-LUTEIN) CAPS capsule Take 1 capsule by mouth daily.    . naproxen (NAPROSYN) 500 MG tablet Take 1 tablet (500 mg total) by mouth 2 (two) times daily with a meal. 20 tablet 0  . Omega-3 Fatty Acids (FISH OIL) 1000 MG CAPS Take 1 capsule by mouth daily.    . ondansetron (ZOFRAN ODT) 4 MG disintegrating tablet 4mg  ODT q6 hours prn nausea/vomit 10 tablet 0  . Probiotic Product (ALIGN) 4 MG CAPS Take 1 capsule by mouth daily.    . valACYclovir (VALTREX) 500 MG tablet Take 2 tablets by mouth at onset of cold sore then 2 tablets 12 hrs later.    . vitamin B-12 (CYANOCOBALAMIN) 1000 MCG tablet Take 1,000 mcg by mouth daily.     No current facility-administered medications on file prior to visit.     Objective:     Vitals:   01/30/17 0931  BP: (!) 154/73  Pulse: 65   Physical examination General NAD,  Conversant  HEENT Atraumatic; Op clear with mmm.  Normo-cephalic. Pupils reactive. Anicteric sclerae  Thyroid/Neck Smooth without nodularity or enlargement. Normal ROM.  Neck Supple.  Skin No rashes, lesions or ulceration. Normal palpated skin turgor. No nodularity.  Breasts: No masses or discharge.  Symmetric.  No axillary adenopathy.  Lungs: Clear to auscultation.No rales or wheezes. Normal Respiratory effort, no retractions.  Heart: NSR.  No murmurs or rubs appreciated. No periferal edema  Abdomen: Soft.  Non-tender.  No masses.  No HSM. No hernia  Extremities: Moves all appropriately.  Normal ROM for age. No lymphadenopathy.  Neuro: Oriented to PPT.  Normal mood. Normal affect.     Pelvic:   Vulva: Normal appearance.  No lesions.  Vagina: No lesions or abnormalities noted.  Support:   Urethra   Meatus   Cervix:   Anus: Normal exam.  No lesions.  Perineum: Normal exam.  No lesions.  Bimanual      Patient declined pelvic exam secondary to significant pain as noted at ultrasound yesterday.              Assessment:    G2P2003 Patient Active Problem List   Diagnosis Date Noted  . HNP (herniated nucleus pulposus), lumbar 12/21/2015  . Stress reaction 11/26/2011  . ACUTE ETHMOIDAL SINUSITIS 08/31/2010  . URINARY FREQUENCY 08/31/2010  . COLD SORE 10/21/2009  . RUQ PAIN 12/03/2008  . UTI 07/22/2008  . BACK PAIN 01/07/2008  . PLANTAR FASCIITIS 01/07/2008  . ALLERGIC RHINITIS 06/18/2007  . OVARIAN CYST 06/18/2007  . DIVERTICULITIS, HX OF 06/18/2007     1. Pelvic pain in female   2. Ovarian cyst, left   3. Preop examination    Ultrasound reveals a 7 1/2 cm simple left ovarian cyst.  Plan:            1.  We have discussed multiple options and the patient is having enough pain that she would like surgery as soon as possible. Expected management and follow-up with surgery as needed also discussed the patient has declined this option.  Based on the acute onset and the  appearance by ultrasound this is very likely a benign ovarian cyst.  Plan for exploratory laparoscopy with left oophorectomy.  LPY for Pain We have discussed the procedure of Exploratory Laparoscopy in detail.  Medical management of pelvic pain has also been discussed and the patient has decided that this option is not satisfactory for her.  She has elected to have a Laparoscopy performed to attempt to diagnose and manage her pain.  I have informed her that Laparoscopy, like other surgical procedures, entails the following risks:  bleeding, infection, damage to bowel, bladder or other internal organ, and the risk or anesthesia.  She is aware that her risks are not limited to these.  We have discussed the possibility of Endometriosis and Pelvic Adhesive Disease as well as complications from her diverticular disease..  We have discussed surgical as well as subsequent medical management of these conditions.   I have answered all of her questions and I believe she has been well informed regarding the risks/benefits of Exploratory Laparoscopy for pelvic pain. Left oophorectomy has been discussed with her in detail. I have also discussed removal of the right ovary should it appear abnormal as well. Should this be a paratubal cyst we have discussed removal as well.        F/U  Return for As Scheduled Post-op.  Finis Bud, M.D. 01/30/2017 11:02 AM

## 2017-01-31 ENCOUNTER — Encounter: Payer: Self-pay | Admitting: *Deleted

## 2017-01-31 ENCOUNTER — Ambulatory Visit
Admission: RE | Admit: 2017-01-31 | Discharge: 2017-01-31 | Disposition: A | Discharge status: Home / Self Care | Payer: Self-pay | Source: Ambulatory Visit | Attending: Obstetrics and Gynecology | Admitting: Obstetrics and Gynecology

## 2017-01-31 ENCOUNTER — Encounter
Admission: RE | Disposition: A | Discharge status: Home / Self Care | Payer: Self-pay | Source: Ambulatory Visit | Attending: Obstetrics and Gynecology

## 2017-01-31 ENCOUNTER — Ambulatory Visit: Payer: Self-pay | Admitting: Registered Nurse

## 2017-01-31 DIAGNOSIS — Z881 Allergy status to other antibiotic agents status: Secondary | ICD-10-CM | POA: Insufficient documentation

## 2017-01-31 DIAGNOSIS — K219 Gastro-esophageal reflux disease without esophagitis: Secondary | ICD-10-CM | POA: Insufficient documentation

## 2017-01-31 DIAGNOSIS — F419 Anxiety disorder, unspecified: Secondary | ICD-10-CM | POA: Insufficient documentation

## 2017-01-31 DIAGNOSIS — M722 Plantar fascial fibromatosis: Secondary | ICD-10-CM | POA: Insufficient documentation

## 2017-01-31 DIAGNOSIS — K5792 Diverticulitis of intestine, part unspecified, without perforation or abscess without bleeding: Secondary | ICD-10-CM | POA: Insufficient documentation

## 2017-01-31 DIAGNOSIS — Z820 Family history of epilepsy and other diseases of the nervous system: Secondary | ICD-10-CM | POA: Insufficient documentation

## 2017-01-31 DIAGNOSIS — M5124 Other intervertebral disc displacement, thoracic region: Secondary | ICD-10-CM | POA: Insufficient documentation

## 2017-01-31 DIAGNOSIS — Z6830 Body mass index (BMI) 30.0-30.9, adult: Secondary | ICD-10-CM | POA: Insufficient documentation

## 2017-01-31 DIAGNOSIS — M1990 Unspecified osteoarthritis, unspecified site: Secondary | ICD-10-CM | POA: Insufficient documentation

## 2017-01-31 DIAGNOSIS — E669 Obesity, unspecified: Secondary | ICD-10-CM | POA: Insufficient documentation

## 2017-01-31 DIAGNOSIS — Z8379 Family history of other diseases of the digestive system: Secondary | ICD-10-CM | POA: Insufficient documentation

## 2017-01-31 DIAGNOSIS — M5126 Other intervertebral disc displacement, lumbar region: Secondary | ICD-10-CM | POA: Insufficient documentation

## 2017-01-31 DIAGNOSIS — Z7982 Long term (current) use of aspirin: Secondary | ICD-10-CM | POA: Insufficient documentation

## 2017-01-31 DIAGNOSIS — R102 Pelvic and perineal pain: Secondary | ICD-10-CM | POA: Insufficient documentation

## 2017-01-31 DIAGNOSIS — J309 Allergic rhinitis, unspecified: Secondary | ICD-10-CM | POA: Insufficient documentation

## 2017-01-31 DIAGNOSIS — Z808 Family history of malignant neoplasm of other organs or systems: Secondary | ICD-10-CM | POA: Insufficient documentation

## 2017-01-31 DIAGNOSIS — Z79899 Other long term (current) drug therapy: Secondary | ICD-10-CM | POA: Insufficient documentation

## 2017-01-31 DIAGNOSIS — N83292 Other ovarian cyst, left side: Secondary | ICD-10-CM | POA: Insufficient documentation

## 2017-01-31 LAB — ABO/RH: ABO/RH(D): O POS

## 2017-01-31 SURGERY — OOPHORECTOMY, LAPAROSCOPIC
Anesthesia: General | Laterality: Bilateral | Wound class: Clean Contaminated

## 2017-01-31 MED ORDER — PROMETHAZINE HCL 25 MG/ML IJ SOLN: 6.2500 mg | INTRAMUSCULAR | Status: DC | PRN

## 2017-01-31 MED ORDER — FENTANYL CITRATE (PF) 100 MCG/2ML IJ SOLN
INTRAMUSCULAR | Status: DC | PRN
  Administered 2017-01-31 (×4): 50 ug via INTRAVENOUS

## 2017-01-31 MED ORDER — ACETAMINOPHEN NICU IV SYRINGE 10 MG/ML
INTRAVENOUS | Status: AC
  Filled 2017-01-31: qty 1

## 2017-01-31 MED ORDER — KETOROLAC TROMETHAMINE 30 MG/ML IJ SOLN
30.0000 mg | Freq: Once | INTRAMUSCULAR | Status: AC
  Administered 2017-01-31: 30 mg via INTRAVENOUS
  Filled 2017-01-31: qty 1

## 2017-01-31 MED ORDER — SODIUM CHLORIDE 0.9 % IJ SOLN
INTRAMUSCULAR | Status: AC
  Filled 2017-01-31: qty 50

## 2017-01-31 MED ORDER — OXYCODONE HCL 5 MG/5ML PO SOLN: 5.0000 mg | Freq: Once | ORAL | Status: AC | PRN

## 2017-01-31 MED ORDER — ROCURONIUM BROMIDE 50 MG/5ML IV SOLN
INTRAVENOUS | Status: AC
  Filled 2017-01-31: qty 1

## 2017-01-31 MED ORDER — ONDANSETRON HCL 4 MG/2ML IJ SOLN
INTRAMUSCULAR | Status: AC
  Filled 2017-01-31: qty 2

## 2017-01-31 MED ORDER — OXYCODONE HCL 5 MG PO TABS
ORAL_TABLET | ORAL | Status: AC
  Administered 2017-01-31: 5 mg via ORAL
  Filled 2017-01-31: qty 1

## 2017-01-31 MED ORDER — LACTATED RINGERS IV SOLN
INTRAVENOUS | Status: DC
  Administered 2017-01-31: 11:00:00 via INTRAVENOUS

## 2017-01-31 MED ORDER — ROCURONIUM BROMIDE 100 MG/10ML IV SOLN
INTRAVENOUS | Status: DC | PRN
  Administered 2017-01-31: 50 mg via INTRAVENOUS

## 2017-01-31 MED ORDER — EPHEDRINE SULFATE 50 MG/ML IJ SOLN
INTRAMUSCULAR | Status: DC | PRN
  Administered 2017-01-31: 10 mg via INTRAVENOUS

## 2017-01-31 MED ORDER — FAMOTIDINE 20 MG PO TABS
ORAL_TABLET | ORAL | Status: AC
  Filled 2017-01-31: qty 1

## 2017-01-31 MED ORDER — ONDANSETRON HCL 4 MG/2ML IJ SOLN
INTRAMUSCULAR | Status: DC | PRN
  Administered 2017-01-31: 4 mg via INTRAVENOUS

## 2017-01-31 MED ORDER — FENTANYL CITRATE (PF) 100 MCG/2ML IJ SOLN: 25.0000 ug | INTRAMUSCULAR | Status: DC | PRN

## 2017-01-31 MED ORDER — PROPOFOL 10 MG/ML IV BOLUS
INTRAVENOUS | Status: DC | PRN
  Administered 2017-01-31: 150 mg via INTRAVENOUS

## 2017-01-31 MED ORDER — KETOROLAC TROMETHAMINE 30 MG/ML IJ SOLN
INTRAMUSCULAR | Status: AC
  Filled 2017-01-31: qty 1

## 2017-01-31 MED ORDER — DEXAMETHASONE SODIUM PHOSPHATE 10 MG/ML IJ SOLN
INTRAMUSCULAR | Status: DC | PRN
  Administered 2017-01-31: 10 mg via INTRAVENOUS

## 2017-01-31 MED ORDER — OXYCODONE HCL 5 MG PO TABS
5.0000 mg | ORAL_TABLET | Freq: Once | ORAL | Status: AC | PRN
  Administered 2017-01-31: 5 mg via ORAL

## 2017-01-31 MED ORDER — OXYCODONE-ACETAMINOPHEN 5-325 MG PO TABS: 1.0000 | ORAL_TABLET | Freq: Four times a day (QID) | ORAL | 0 refills | Status: DC | PRN

## 2017-01-31 MED ORDER — DEXAMETHASONE SODIUM PHOSPHATE 10 MG/ML IJ SOLN
INTRAMUSCULAR | Status: AC
  Filled 2017-01-31: qty 1

## 2017-01-31 MED ORDER — BUPIVACAINE HCL 0.5 % IJ SOLN
INTRAMUSCULAR | Status: DC | PRN
  Administered 2017-01-31: 9 mL

## 2017-01-31 MED ORDER — LIDOCAINE HCL (PF) 2 % IJ SOLN
INTRAMUSCULAR | Status: AC
  Filled 2017-01-31: qty 2

## 2017-01-31 MED ORDER — BUPIVACAINE HCL (PF) 0.5 % IJ SOLN
INTRAMUSCULAR | Status: AC
  Filled 2017-01-31: qty 30

## 2017-01-31 MED ORDER — ACETAMINOPHEN 10 MG/ML IV SOLN
INTRAVENOUS | Status: DC | PRN
  Administered 2017-01-31: 1000 mg via INTRAVENOUS

## 2017-01-31 MED ORDER — SUGAMMADEX SODIUM 200 MG/2ML IV SOLN
INTRAVENOUS | Status: AC
  Filled 2017-01-31: qty 2

## 2017-01-31 MED ORDER — FENTANYL CITRATE (PF) 100 MCG/2ML IJ SOLN
INTRAMUSCULAR | Status: AC
  Filled 2017-01-31: qty 2

## 2017-01-31 MED ORDER — SUGAMMADEX SODIUM 500 MG/5ML IV SOLN
INTRAVENOUS | Status: AC
  Filled 2017-01-31: qty 5

## 2017-01-31 MED ORDER — LIDOCAINE HCL (CARDIAC) 20 MG/ML IV SOLN
INTRAVENOUS | Status: DC | PRN
  Administered 2017-01-31: 100 mg via INTRAVENOUS

## 2017-01-31 MED ORDER — VASOPRESSIN 20 UNIT/ML IV SOLN
INTRAVENOUS | Status: AC
  Filled 2017-01-31: qty 1

## 2017-01-31 MED ORDER — MEPERIDINE HCL 50 MG/ML IJ SOLN: 6.2500 mg | INTRAMUSCULAR | Status: DC | PRN

## 2017-01-31 MED ORDER — PROPOFOL 10 MG/ML IV BOLUS
INTRAVENOUS | Status: AC
  Filled 2017-01-31: qty 20

## 2017-01-31 MED ORDER — FAMOTIDINE 20 MG PO TABS
20.0000 mg | ORAL_TABLET | Freq: Once | ORAL | Status: AC
  Administered 2017-01-31: 20 mg via ORAL

## 2017-01-31 MED ORDER — MIDAZOLAM HCL 2 MG/2ML IJ SOLN
INTRAMUSCULAR | Status: DC | PRN
  Administered 2017-01-31: 2 mg via INTRAVENOUS

## 2017-01-31 MED ORDER — MIDAZOLAM HCL 2 MG/2ML IJ SOLN
INTRAMUSCULAR | Status: AC
  Filled 2017-01-31: qty 2

## 2017-01-31 MED ORDER — PHENYLEPHRINE HCL 10 MG/ML IJ SOLN
INTRAMUSCULAR | Status: DC | PRN
  Administered 2017-01-31: 200 ug via INTRAVENOUS

## 2017-01-31 SURGICAL SUPPLY — 55 items
ADH LQ OCL WTPRF AMP STRL LF (MISCELLANEOUS)
ADHESIVE MASTISOL STRL (MISCELLANEOUS) ×1 IMPLANT
BAG DECANTER FOR FLEXI CONT (MISCELLANEOUS) ×3 IMPLANT
BLADE SURG 15 STRL SS SAFETY (BLADE) ×3 IMPLANT
BLADE SURG SZ11 CARB STEEL (BLADE) ×3 IMPLANT
CANISTER SUCT 1200ML W/VALVE (MISCELLANEOUS) ×3 IMPLANT
CATH ROBINSON RED A/P 16FR (CATHETERS) ×3 IMPLANT
CATH TRAY 16F METER LATEX (MISCELLANEOUS) ×3 IMPLANT
CHLORAPREP W/TINT 26ML (MISCELLANEOUS) ×3 IMPLANT
CLEANER CAUTERY TIP 5X5 PAD (MISCELLANEOUS) ×1 IMPLANT
CORD MONOPOLAR M/FML 12FT (MISCELLANEOUS) IMPLANT
COVER MAYO STAND STRL (DRAPES) ×3 IMPLANT
DRAPE LEGGINS SURG 28X43 STRL (DRAPES) ×3 IMPLANT
DRAPE XRAY CASSETTE 23X24 (DRAPES) ×3 IMPLANT
ELECT REM PT RETURN 9FT ADLT (ELECTROSURGICAL) ×3
ELECTRODE REM PT RTRN 9FT ADLT (ELECTROSURGICAL) ×2 IMPLANT
GOWN STRL REUS W/ TWL LRG LVL3 (GOWN DISPOSABLE) ×4 IMPLANT
GOWN STRL REUS W/TWL LRG LVL3 (GOWN DISPOSABLE) ×6
GOWN STRL REUS W/TWL XL LVL3 (GOWN DISPOSABLE) ×3 IMPLANT
HANDLE YANKAUER SUCT BULB TIP (MISCELLANEOUS) ×3 IMPLANT
IRRIGATION STRYKERFLOW (MISCELLANEOUS) ×1 IMPLANT
IRRIGATOR STRYKERFLOW (MISCELLANEOUS)
IV LACTATED RINGERS 1000ML (IV SOLUTION) ×1 IMPLANT
KIT PINK PAD W/HEAD ARE REST (MISCELLANEOUS) ×3
KIT PINK PAD W/HEAD ARM REST (MISCELLANEOUS) ×2 IMPLANT
KIT RM TURNOVER CYSTO AR (KITS) ×3 IMPLANT
LIGASURE MARYLAND LAP STAND (ELECTROSURGICAL) ×3 IMPLANT
NEEDLE HYPO 22GX1.5 SAFETY (NEEDLE) ×3 IMPLANT
NS IRRIG 500ML POUR BTL (IV SOLUTION) ×3 IMPLANT
PACK GYN LAPAROSCOPIC (MISCELLANEOUS) ×3 IMPLANT
PAD CLEANER CAUTERY TIP 5X5 (MISCELLANEOUS)
PAD OB MATERNITY 4.3X12.25 (PERSONAL CARE ITEMS) ×3 IMPLANT
PAD PREP 24X41 OB/GYN DISP (PERSONAL CARE ITEMS) ×3 IMPLANT
POUCH ENDO CATCH 10MM SPEC (MISCELLANEOUS) IMPLANT
SCISSORS METZENBAUM CVD 33 (INSTRUMENTS) IMPLANT
SLEEVE ENDOPATH XCEL 5M (ENDOMECHANICALS) ×3 IMPLANT
SPONGE LAP 18X18 5 PK (GAUZE/BANDAGES/DRESSINGS) ×1 IMPLANT
SPONGE XRAY 4X4 16PLY STRL (MISCELLANEOUS) ×1 IMPLANT
STRIP CLOSURE SKIN 1/2X4 (GAUZE/BANDAGES/DRESSINGS) ×3 IMPLANT
SUT VIC AB 0 CT1 18XCR BRD 8 (SUTURE) ×1 IMPLANT
SUT VIC AB 0 CT1 36 (SUTURE) ×3 IMPLANT
SUT VIC AB 0 CT1 8-18 (SUTURE)
SUT VIC AB 3-0 SH 27 (SUTURE)
SUT VIC AB 3-0 SH 27X BRD (SUTURE) IMPLANT
SUT VICRYL 0 AB UR-6 (SUTURE) ×3 IMPLANT
SUT VICRYL 0 UR6 27IN ABS (SUTURE) ×4 IMPLANT
SYR 20CC LL (SYRINGE) ×2 IMPLANT
SYR 3ML LL SCALE MARK (SYRINGE) ×1 IMPLANT
SYRINGE 10CC LL (SYRINGE) ×3 IMPLANT
TOWEL OR 17X26 4PK STRL BLUE (TOWEL DISPOSABLE) ×3 IMPLANT
TROCAR ENDO BLADELESS 11MM (ENDOMECHANICALS) ×3 IMPLANT
TROCAR XCEL NON-BLD 5MMX100MML (ENDOMECHANICALS) ×3 IMPLANT
TROCAR XCEL UNIV SLVE 11M 100M (ENDOMECHANICALS) ×1 IMPLANT
TUBING CONNECTING 10 (TUBING) ×3 IMPLANT
TUBING INSUFFLATOR HI FLOW (MISCELLANEOUS) ×3 IMPLANT

## 2017-01-31 NOTE — Anesthesia Preprocedure Evaluation (Signed)
Anesthesia Evaluation  Patient identified by MRN, date of birth, ID band Patient awake    Reviewed: Allergy & Precautions, NPO status , Patient's Chart, lab work & pertinent test results  History of Anesthesia Complications Negative for: history of anesthetic complications  Airway Mallampati: II  TM Distance: >3 FB Neck ROM: Full    Dental  (+) Caps   Pulmonary neg pulmonary ROS, neg sleep apnea, neg COPD,    breath sounds clear to auscultation- rhonchi (-) wheezing      Cardiovascular Exercise Tolerance: Good (-) hypertension(-) CAD and (-) Past MI  Rhythm:Regular Rate:Normal - Systolic murmurs and - Diastolic murmurs    Neuro/Psych PSYCHIATRIC DISORDERS Anxiety negative neurological ROS     GI/Hepatic Neg liver ROS, GERD  ,  Endo/Other  negative endocrine ROSneg diabetes  Renal/GU negative Renal ROS     Musculoskeletal  (+) Arthritis ,   Abdominal (+) + obese,   Peds  Hematology negative hematology ROS (+)   Anesthesia Other Findings Past Medical History: No date: Allergy     Comment: allergic rhinitis No date: Arthritis No date: Diverticulitis     Comment: Hx of No date: GERD (gastroesophageal reflux disease) No date: HNP (herniated nucleus pulposus with myelopath* No date: UTI (lower urinary tract infection)   Reproductive/Obstetrics                             Anesthesia Physical Anesthesia Plan  ASA: II  Anesthesia Plan: General   Post-op Pain Management:    Induction: Intravenous  Airway Management Planned: Oral ETT  Additional Equipment:   Intra-op Plan:   Post-operative Plan: Extubation in OR  Informed Consent: I have reviewed the patients History and Physical, chart, labs and discussed the procedure including the risks, benefits and alternatives for the proposed anesthesia with the patient or authorized representative who has indicated his/her understanding  and acceptance.   Dental advisory given  Plan Discussed with: CRNA and Anesthesiologist  Anesthesia Plan Comments:         Anesthesia Quick Evaluation

## 2017-01-31 NOTE — Anesthesia Procedure Notes (Signed)
Procedure Name: Intubation Date/Time: 01/31/2017 11:24 AM Performed by: Doreen Salvage Pre-anesthesia Checklist: Patient identified, Patient being monitored, Timeout performed, Emergency Drugs available and Suction available Patient Re-evaluated:Patient Re-evaluated prior to inductionOxygen Delivery Method: Circle system utilized Preoxygenation: Pre-oxygenation with 100% oxygen Intubation Type: IV induction Ventilation: Mask ventilation without difficulty Laryngoscope Size: Mac and 3 Grade View: Grade I Tube type: Oral Tube size: 7.0 mm Number of attempts: 1 Airway Equipment and Method: Stylet Placement Confirmation: ETT inserted through vocal cords under direct vision,  positive ETCO2 and breath sounds checked- equal and bilateral Secured at: 20 cm Tube secured with: Tape Dental Injury: Teeth and Oropharynx as per pre-operative assessment

## 2017-01-31 NOTE — Op Note (Signed)
@  LOGO@    OPERATIVE NOTE 01/31/2017 12:43 PM  PRE-OPERATIVE DIAGNOSIS:  1) pelvic pain, left ovarian cyst  POST-OPERATIVE DIAGNOSIS:  1) Same with large simple left ovarian cyst  OPERATION:  LAPAROSCOPIC OOPHORECTOMY:  Bilateral  SURGEON(S): Surgeon(s) and Role:    * [name], MD - Primary   ANESTHESIA: General ET  ESTIMATED BLOOD LOSS: 20 ml OPERATIVE FINDINGS: Large left simple ovarian cyst - clear fluid.  SPECIMEN:  ID Type Source Tests Collected by Time Destination  1 : bilateral ovaries Tissue [facility] Ovary biopsy SURGICAL PATHOLOGY [name], MD 03/05/4080 [id number]     COMPLICATIONS: None  DISPOSITION: Stable to recovery room  DESCRIPTION OF PROCEDURE:      The patient was prepped and draped in the dorsolithotomy position and placed under general anesthesia. The bladder was emptied. The cervix was grasped with a multi-toothed tenaculum and a uterine manipulator was placed within the cervical os respecting the position and curvature of the uterus. After changing gloves we proceeded abdominally. A small infraumbilical incision was made and a 5 mm trocar port was placed within the abdominopelvic cavity. The opening pressure was less than 7 mmHg.  Approximately 3 and 1/2 L of carbon dioxide gas was instilled within the abdominal pelvic cavity. The laparoscope was placed and the pelvis and abdomen were carefully inspected. A large left ovarian cyst was noted on the left.  The ureters were carefully identified deep in the pelvis away from the operative sites and locations. The large simple cyst was noted to be arising directly from the normal-appearing left ovary. The left ovary was retracted medially and the uterosacral ligament was systematically coagulated triply and divided using the LigaSure. The cyst remained intact. An Endo Catch bag was placed in the left lower quadrant port and the ovary and mass were placed within the Endo Catch bag. The structure was too large to  fit completely into the bag a laparoscopic aspirator was placed through the other port then placed deep within the bag and a small puncture was made in the lower aspect of the cystic structure. Clear fluid was systematically aspirated from this cyst allowing the Endo Catch bag to complete completely close. The bag was then removed through the left lower quadrant port incision. The right ovary was then retracted medially and the infundibulopelvic ligament was carefully identified. It was triply coagulated and divided. The normal-appearing right ovary was removed through the left lower quadrant port incision. All pedicles were carefully inspected and hemostasis was noted there were no other pelvic abnormalities encountered.(There was the appearance of a posterior small uterine fundal fibroid.) Hemostasis of all areas of the pelvis was noted. The ports were removed under direct visualization.  Hemostasis was noted.  The laparoscope was removed the trocar sleeve was removed and the incision was closed with a deep suture through the fascia of 0 Vicryl followed by subcuticular closure of the skin for all port sites. A long-acting anesthetic was injected.  Steri-Strips were applied. The uterine manipulator was removed. Hemostasis of the cervix was noted. The patient went to the recovery room in stable condition.  Finis Bud, M.D. 01/31/2017 12:43 PM

## 2017-01-31 NOTE — Transfer of Care (Signed)
Immediate Anesthesia Transfer of Care Note  Patient: Katelyn Rivera  Procedure(s) Performed: Procedure(s): LAPAROSCOPIC OOPHORECTOMY (Bilateral)  Patient Location: PACU  Anesthesia Type:General  Level of Consciousness: sedated  Airway & Oxygen Therapy: Patient Spontanous Breathing and Patient connected to face mask oxygen  Post-op Assessment: Report given to RN and Post -op Vital signs reviewed and stable  Post vital signs: Reviewed and stable  Last Vitals:  Vitals:   01/31/17 1017 01/31/17 1258  BP: (!) 142/76 (!) 153/72  Pulse: 66 69  Resp: 18 11  Temp: 36.5 C 58.7 C    Complications: No apparent anesthesia complications

## 2017-01-31 NOTE — Anesthesia Post-op Follow-up Note (Cosign Needed)
Anesthesia QCDR form completed.        

## 2017-01-31 NOTE — H&P (View-Only) (Signed)
PRE-OPERATIVE HISTORY AND PHYSICAL EXAM  PCP:  No PCP Per Patient Subjective:   HPI:  Katelyn Rivera is a 66 y.o. G2P2003.  No LMP recorded. Patient is postmenopausal.  She presents today for a pre-op discussion and PE.  She has the following symptoms:  Continued pelvic pain which is disabling. Ultrasound revealing large left cystic mass likely consistent with ovarian cyst   Review of Systems:   Constitutional: Denied constitutional symptoms, night sweats, recent illness, fatigue, fever, insomnia and weight loss.  Eyes: Denied eye symptoms, eye pain, photophobia, vision change and visual disturbance.  Ears/Nose/Throat/Neck: Denied ear, nose, throat or neck symptoms, hearing loss, nasal discharge, sinus congestion and sore throat.  Cardiovascular: Denied cardiovascular symptoms, arrhythmia, chest pain/pressure, edema, exercise intolerance, orthopnea and palpitations.  Respiratory: Denied pulmonary symptoms, asthma, pleuritic pain, productive sputum, cough, dyspnea and wheezing.  Gastrointestinal: Denied, gastro-esophageal reflux, melena, nausea and vomiting.  Genitourinary: See HPI for additional information.  Musculoskeletal: Denied musculoskeletal symptoms, stiffness, swelling, muscle weakness and myalgia.  Dermatologic: Denied dermatology symptoms, rash and scar.  Neurologic: Denied neurology symptoms, dizziness, headache, neck pain and syncope.  Psychiatric: Denied psychiatric symptoms, anxiety and depression.  Endocrine: Denied endocrine symptoms including hot flashes and night sweats.   OB History  Gravida Para Term Preterm AB Living  2 2 2     3   SAB TAB Ectopic Multiple Live Births        1 3    # Outcome Date GA Lbr Len/2nd Weight Sex Delivery Anes PTL Lv  2A Term 65    M Vag-Spont   LIV  2B Term 37    M Vag-Spont   LIV  1 Term 49    F Vag-Spont   LIV      Past Medical History:  Diagnosis Date  . Allergy    allergic rhinitis  . Arthritis   .  Diverticulitis    Hx of  . GERD (gastroesophageal reflux disease)   . HNP (herniated nucleus pulposus with myelopathy), thoracic   . UTI (lower urinary tract infection)     Past Surgical History:  Procedure Laterality Date  . COLONOSCOPY    . LUMBAR LAMINECTOMY/DECOMPRESSION MICRODISCECTOMY Left 12/21/2015   Procedure: Left lumbar four-fivelumbar laminotomy and microdiscectomy;  Surgeon: Jovita Gamma, MD;  Location: [facility];  Service: Neurosurgery;  Laterality: Left;  . REFRACTIVE SURGERY    . TUBAL LIGATION        SOCIAL HISTORY: History  Smoking Status  . Never Smoker  Smokeless Tobacco  . Never Used   History  Alcohol Use No   History  Drug Use No    Family History  Problem Relation Age of Onset  . Diverticulitis Mother   . Alzheimer's disease Mother   . Cancer Father     brain tumor  . Diverticulitis Sister   . Diverticulitis Sister     ALLERGIES:  Tetracycline  MEDS:   Current Outpatient Prescriptions on File Prior to Visit  Medication Sig Dispense Refill  . aspirin 81 MG chewable tablet Chew by mouth daily.    . cetirizine (ZYRTEC) 10 MG tablet Take 10 mg by mouth daily.      . cholecalciferol (VITAMIN D) 1000 units tablet Take 1,000 Units by mouth daily.    Marland Kitchen dicyclomine (BENTYL) 10 MG capsule Take 1 capsule (10 mg total) by mouth 3 (three) times daily as needed for spasms. 16 capsule 0  . folic acid (FOLVITE) 329 MCG tablet  Take 800 mcg by mouth daily.     . Glucosamine 500 MG CAPS Take 1 capsule by mouth daily.      Marland Kitchen HYDROcodone-acetaminophen (NORCO/VICODIN) 5-325 MG tablet Take 1 tablet by mouth every 8 (eight) hours as needed for moderate pain.    Marland Kitchen HYDROcodone-acetaminophen (NORCO/VICODIN) 5-325 MG tablet Take 1-2 tablets by mouth every 4 (four) hours as needed (mild pain). 50 tablet 0  . Lysine 500 MG TABS Take 1 tablet by mouth daily.    . multivitamin-lutein (OCUVITE-LUTEIN) CAPS capsule Take 1 capsule by mouth daily.    . naproxen  (NAPROSYN) 500 MG tablet Take 1 tablet (500 mg total) by mouth 2 (two) times daily with a meal. 20 tablet 0  . Omega-3 Fatty Acids (FISH OIL) 1000 MG CAPS Take 1 capsule by mouth daily.    . ondansetron (ZOFRAN ODT) 4 MG disintegrating tablet 4mg  ODT q6 hours prn nausea/vomit 10 tablet 0  . Probiotic Product (ALIGN) 4 MG CAPS Take 1 capsule by mouth daily.    . valACYclovir (VALTREX) 500 MG tablet Take 2 tablets by mouth at onset of cold sore then 2 tablets 12 hrs later.    . vitamin B-12 (CYANOCOBALAMIN) 1000 MCG tablet Take 1,000 mcg by mouth daily.     No current facility-administered medications on file prior to visit.     No orders of the defined types were placed in this encounter.    Physical examination BP (!) 154/73   Pulse 65   Wt 178 lb 8 oz (81 kg)   BMI 30.64 kg/m   General NAD, Conversant  HEENT Atraumatic; Op clear with mmm.  Normo-cephalic. Pupils reactive. Anicteric sclerae  Thyroid/Neck Smooth without nodularity or enlargement. Normal ROM.  Neck Supple.  Skin No rashes, lesions or ulceration. Normal palpated skin turgor. No nodularity.  Breasts: No masses or discharge.  Symmetric.  No axillary adenopathy.  Lungs: Clear to auscultation.No rales or wheezes. Normal Respiratory effort, no retractions.  Heart: NSR.  No murmurs or rubs appreciated. No periferal edema  Abdomen: Soft.  Non-tender.  No masses.  No HSM. No hernia  Extremities: Moves all appropriately.  Normal ROM for age. No lymphadenopathy.  Neuro: Oriented to PPT.  Normal mood. Normal affect.     Pelvic:   Vulva: Normal appearance.  No lesions.  Vagina:   Support:   Urethra   Meatus   Cervix:   Anus: Normal exam.  No lesions.  Perineum: Normal exam.  No lesions.        Bimanual   Uterus:   Adnexae:   Cul-de-sac:   Patient declined examination because of her pelvic pain especially in light of her inability to have a vaginal ultrasound yesterday.  Assessment:   G2P2003 Patient Active Problem  List   Diagnosis Date Noted  . HNP (herniated nucleus pulposus), lumbar 12/21/2015  . Stress reaction 11/26/2011  . ACUTE ETHMOIDAL SINUSITIS 08/31/2010  . URINARY FREQUENCY 08/31/2010  . COLD SORE 10/21/2009  . RUQ PAIN 12/03/2008  . UTI 07/22/2008  . BACK PAIN 01/07/2008  . PLANTAR FASCIITIS 01/07/2008  . ALLERGIC RHINITIS 06/18/2007  . OVARIAN CYST 06/18/2007  . DIVERTICULITIS, HX OF 06/18/2007    1. Pelvic pain in female   2. Ovarian cyst, left   3. Preop examination      Plan:   1.  Laparoscopic oophorectomy for ovarian cyst.  Pre-op discussions regarding Risks and Benefits of her scheduled surgery. Please see office notes.  Finis Bud, M.D. 01/30/2017  11:17 AM

## 2017-01-31 NOTE — Anesthesia Postprocedure Evaluation (Signed)
Anesthesia Post Note  Patient: Katelyn Rivera  Procedure(s) Performed: Procedure(s) (LRB): LAPAROSCOPIC OOPHORECTOMY (Bilateral)  Patient location during evaluation: PACU Anesthesia Type: General Level of consciousness: awake and alert and oriented Pain management: pain level controlled Vital Signs Assessment: post-procedure vital signs reviewed and stable Respiratory status: spontaneous breathing, nonlabored ventilation and respiratory function stable Cardiovascular status: blood pressure returned to baseline and stable Postop Assessment: no signs of nausea or vomiting Anesthetic complications: no     Last Vitals:  Vitals:   01/31/17 1313 01/31/17 1328  BP: 137/71 (!) 146/70  Pulse: 63 (!) 59  Resp: 17 (!) 9  Temp:      Last Pain:  Vitals:   01/31/17 1017  TempSrc: Oral  PainSc: 4                  Damiean Lukes

## 2017-01-31 NOTE — Interval H&P Note (Signed)
History and Physical Interval Note:  01/31/2017 11:11 AM  [name]  has presented today for surgery, with the diagnosis of pelvic pain, left ovarian cyst  The various methods of treatment have been discussed with the patient and family. After consideration of risks, benefits and other options for treatment, the patient has consented to  Procedure(s): LAPAROSCOPIC LEFT SALPINGO OOPHORECTOMY (Left) as a surgical intervention .  The patient's history has been reviewed, patient examined, no change in status, stable for surgery.  I have reviewed the patient's chart and labs.  Questions were answered to the patient's satisfaction.   Pt seen in pre-anesthesia area and has expressed her desire for both ovaries to be removed.  This was discussed at the office and discussed with her husband who confirmed her desire for this.  The pre-op consent was for oophorectomy and pt signed this appropriately.   Katelyn Rivera

## 2017-01-31 NOTE — Discharge Instructions (Signed)
Diagnostic Laparoscopy A diagnostic laparoscopy is a procedure to diagnose diseases in the abdomen. During the procedure, a thin, lighted, pencil-sized instrument called a laparoscope is inserted into the abdomen through an incision. The laparoscope allows your health care provider to look at the organs inside your body. Tell a health care provider about:  Any allergies you have.  All medicines you are taking, including vitamins, herbs, eye drops, creams, and over-the-counter medicines.  Any problems you or family members have had with anesthetic medicines.  Any blood disorders you have.  Any surgeries you have had.  Any medical conditions you have. What are the risks? Generally, this is a safe procedure. However, problems can occur, which may include:  Infection.  Bleeding.  Damage to other organs.  Allergic reaction to the anesthetics used during the procedure. What happens before the procedure?  Do not eat or drink anything after midnight on the night before the procedure or as directed by your health care provider.  Ask your health care provider about:  Changing or stopping your regular medicines.  Taking medicines such as aspirin and ibuprofen. These medicines can thin your blood. Do not take these medicines before your procedure if your health care provider instructs you not to.  Plan to have someone take you home after the procedure. What happens during the procedure?  You may be given a medicine to help you relax (sedative).  You will be given a medicine to make you sleep (general anesthetic).  Your abdomen will be inflated with a gas. This will make your organs easier to see.  Small incisions will be made in your abdomen.  A laparoscope and other small instruments will be inserted into the abdomen through the incisions.  A tissue sample may be removed from an organ in the abdomen for examination.  The instruments will be removed from the abdomen.  The gas  will be released.  The incisions will be closed with stitches (sutures). What happens after the procedure? Your blood pressure, heart rate, breathing rate, and blood oxygen level will be monitored often until the medicines you were given have worn off. This information is not intended to replace advice given to you by your health care provider. Make sure you discuss any questions you have with your health care provider. Document Released: 02/11/2001 Document Revised: 03/15/2016 Document Reviewed: 06/18/2014 Elsevier Interactive Patient Education  2017 [place]   1) The drugs that you were given will stay in your system until tomorrow so for the next 24 hours you should not:  A) Drive an automobile B) Make any legal decisions C) Drink any alcoholic beverage   2) You may resume regular meals tomorrow.  Today it is better to start with liquids and gradually work up to solid foods.  You may eat anything you prefer, but it is better to start with liquids, then soup and crackers, and gradually work up to solid foods.   3) Please notify your doctor immediately if you have any unusual bleeding, trouble breathing, redness and pain at the surgery site, drainage, fever, or pain not relieved by medication.   4) Additional Instructions:Splint abdominal incisions with a small towel or pillow every time you cough, sneeze or move.

## 2017-02-01 LAB — SURGICAL PATHOLOGY

## 2017-02-07 ENCOUNTER — Ambulatory Visit (INDEPENDENT_AMBULATORY_CARE_PROVIDER_SITE_OTHER): Payer: Self-pay | Admitting: Obstetrics and Gynecology

## 2017-02-07 ENCOUNTER — Encounter: Payer: Self-pay | Admitting: Obstetrics and Gynecology

## 2017-02-07 VITALS — BP 157/78 | HR 71 | Wt 172.5 lb

## 2017-02-07 DIAGNOSIS — Z9889 Other specified postprocedural states: Secondary | ICD-10-CM

## 2017-02-07 DIAGNOSIS — N952 Postmenopausal atrophic vaginitis: Secondary | ICD-10-CM

## 2017-02-07 MED ORDER — ESTRADIOL 0.1 MG/GM VA CREA: 0.2500 | TOPICAL_CREAM | Freq: Every day | VAGINAL | 3 refills | Status: DC

## 2017-02-07 NOTE — Progress Notes (Signed)
HPI:      Katelyn Rivera is a 66 y.o. G2P2003 who LMP was No LMP recorded. Patient is postmenopausal.  Subjective:   She presents today  1 week postop from bilateral oophorectomy for large left endometrial cyst. She is doing well. Her pain is easily controlled. She has resumed normal activities. Her only complaint is constipation. She has constipation regularly and she is currently using Senokot.  She does complain today of vaginal dryness which she states has been an ongoing problem.    Hx: The following portions of the patient's history were reviewed and updated as appropriate:              She  has a past medical history of Allergy; Arthritis; Diverticulitis; GERD (gastroesophageal reflux disease); HNP (herniated nucleus pulposus with myelopathy), thoracic; and UTI (lower urinary tract infection). She  does not have any pertinent problems on file. She  has a past surgical history that includes Tubal ligation; Refractive surgery; Colonoscopy; Lumbar laminectomy/decompression microdiscectomy (Left, 12/21/2015); Back surgery (12/2015); Eye surgery (Bilateral); and Oophorectomy. Her family history includes Alzheimer's disease in her mother; Cancer in her father; Diverticulitis in her mother, sister, and sister. She  reports that she has never smoked. She has never used smokeless tobacco. She reports that she does not drink alcohol or use drugs. Current Outpatient Prescriptions on File Prior to Visit  Medication Sig Dispense Refill  . aspirin 81 MG chewable tablet Chew 81 mg by mouth daily.     . cetirizine (ZYRTEC) 10 MG tablet Take 10 mg by mouth daily.      . cholecalciferol (VITAMIN D) 1000 units tablet Take 1,000 Units by mouth daily.    Marland Kitchen dicyclomine (BENTYL) 10 MG capsule Take 1 capsule (10 mg total) by mouth 3 (three) times daily as needed for spasms. 16 capsule 0  . folic acid (FOLVITE) 536 MCG tablet Take 800 mcg by mouth daily.     . Glucosamine 500 MG CAPS Take 1 capsule by mouth  daily.      Marland Kitchen Lysine 500 MG TABS Take 1 tablet by mouth daily.    . multivitamin-lutein (OCUVITE-LUTEIN) CAPS capsule Take 1 capsule by mouth daily.    . naproxen (NAPROSYN) 500 MG tablet Take 1 tablet (500 mg total) by mouth 2 (two) times daily with a meal. 20 tablet 0  . Omega-3 Fatty Acids (FISH OIL) 1000 MG CAPS Take 1 capsule by mouth daily.    . Probiotic Product (ALIGN) 4 MG CAPS Take 1 capsule by mouth daily.    . vitamin B-12 (CYANOCOBALAMIN) 1000 MCG tablet Take 1,000 mcg by mouth daily.    . ondansetron (ZOFRAN ODT) 4 MG disintegrating tablet 4mg  ODT q6 hours prn nausea/vomit (Patient not taking: Reported on 02/07/2017) 10 tablet 0  . oxyCODONE-acetaminophen (PERCOCET/ROXICET) 5-325 MG tablet Take 1-2 tablets by mouth every 6 (six) hours as needed. (Patient not taking: Reported on 02/07/2017) 30 tablet 0  . valACYclovir (VALTREX) 500 MG tablet Take 2 tablets by mouth at onset of cold sore then 2 tablets 12 hrs later. as needed     No current facility-administered medications on file prior to visit.          Review of Systems:  Review of Systems  Constitutional: Denied constitutional symptoms, night sweats, recent illness, fatigue, fever, insomnia and weight loss.  Eyes: Denied eye symptoms, eye pain, photophobia, vision change and visual disturbance.  Ears/Nose/Throat/Neck: Denied ear, nose, throat or neck symptoms, hearing loss, nasal discharge, sinus  congestion and sore throat.  Cardiovascular: Denied cardiovascular symptoms, arrhythmia, chest pain/pressure, edema, exercise intolerance, orthopnea and palpitations.  Respiratory: Denied pulmonary symptoms, asthma, pleuritic pain, productive sputum, cough, dyspnea and wheezing.  Gastrointestinal: Denied, gastro-esophageal reflux, melena, nausea and vomiting.  Genitourinary: See HPI for additional information.  Musculoskeletal: Denied musculoskeletal symptoms, stiffness, swelling, muscle weakness and myalgia.  Dermatologic: Denied  dermatology symptoms, rash and scar.  Neurologic: Denied neurology symptoms, dizziness, headache, neck pain and syncope.  Psychiatric: Denied psychiatric symptoms, anxiety and depression.  Endocrine: See HPI for additional information.   Meds:   Current Outpatient Prescriptions on File Prior to Visit  Medication Sig Dispense Refill  . aspirin 81 MG chewable tablet Chew 81 mg by mouth daily.     . cetirizine (ZYRTEC) 10 MG tablet Take 10 mg by mouth daily.      . cholecalciferol (VITAMIN D) 1000 units tablet Take 1,000 Units by mouth daily.    Marland Kitchen dicyclomine (BENTYL) 10 MG capsule Take 1 capsule (10 mg total) by mouth 3 (three) times daily as needed for spasms. 16 capsule 0  . folic acid (FOLVITE) 045 MCG tablet Take 800 mcg by mouth daily.     . Glucosamine 500 MG CAPS Take 1 capsule by mouth daily.      Marland Kitchen Lysine 500 MG TABS Take 1 tablet by mouth daily.    . multivitamin-lutein (OCUVITE-LUTEIN) CAPS capsule Take 1 capsule by mouth daily.    . naproxen (NAPROSYN) 500 MG tablet Take 1 tablet (500 mg total) by mouth 2 (two) times daily with a meal. 20 tablet 0  . Omega-3 Fatty Acids (FISH OIL) 1000 MG CAPS Take 1 capsule by mouth daily.    . Probiotic Product (ALIGN) 4 MG CAPS Take 1 capsule by mouth daily.    . vitamin B-12 (CYANOCOBALAMIN) 1000 MCG tablet Take 1,000 mcg by mouth daily.    . ondansetron (ZOFRAN ODT) 4 MG disintegrating tablet 4mg  ODT q6 hours prn nausea/vomit (Patient not taking: Reported on 02/07/2017) 10 tablet 0  . oxyCODONE-acetaminophen (PERCOCET/ROXICET) 5-325 MG tablet Take 1-2 tablets by mouth every 6 (six) hours as needed. (Patient not taking: Reported on 02/07/2017) 30 tablet 0  . valACYclovir (VALTREX) 500 MG tablet Take 2 tablets by mouth at onset of cold sore then 2 tablets 12 hrs later. as needed     No current facility-administered medications on file prior to visit.     Objective:     Vitals:   02/07/17 1548  BP: (!) 157/78  Pulse: 71                Abdomen: Soft.  Non-tender.  No masses.  No HSM.  Incision/s: Intact.  Healing well.  No erythema.  No drainage.      Assessment:    G2P2003 Patient Active Problem List   Diagnosis Date Noted  . HNP (herniated nucleus pulposus), lumbar 12/21/2015  . Stress reaction 11/26/2011  . ACUTE ETHMOIDAL SINUSITIS 08/31/2010  . URINARY FREQUENCY 08/31/2010  . COLD SORE 10/21/2009  . RUQ PAIN 12/03/2008  . UTI 07/22/2008  . BACK PAIN 01/07/2008  . PLANTAR FASCIITIS 01/07/2008  . ALLERGIC RHINITIS 06/18/2007  . OVARIAN CYST 06/18/2007  . DIVERTICULITIS, HX OF 06/18/2007     1. Post-operative state   2. Vaginal atrophy      Patient doing well postop.   Plan:            1.   Surgery discussed in detail. Pictures reviewed.  2.  We have discussed the use of vaginal estrogen in multiple forms and she has chosen Estrace cream to try.    Meds ordered this encounter  Medications  . estradiol (ESTRACE) 0.1 MG/GM vaginal cream    Sig: Place 4.19 Applicatorfuls vaginally at bedtime.    Dispense:  90 g    Refill:  3        F/U  Return in about 4 weeks (around 03/07/2017).  Finis Bud, M.D. 02/07/2017 4:39 PM

## 2017-02-08 ENCOUNTER — Other Ambulatory Visit: Payer: Self-pay

## 2017-02-08 ENCOUNTER — Encounter: Payer: Self-pay | Admitting: Obstetrics and Gynecology

## 2017-02-08 DIAGNOSIS — N952 Postmenopausal atrophic vaginitis: Secondary | ICD-10-CM

## 2017-02-08 MED ORDER — ESTRADIOL 0.1 MG/GM VA CREA: 0.2500 | TOPICAL_CREAM | Freq: Every day | VAGINAL | 3 refills | Status: AC

## 2017-02-08 NOTE — Telephone Encounter (Signed)
Script escribed.

## 2017-02-26 DIAGNOSIS — Z90722 Acquired absence of ovaries, bilateral: Secondary | ICD-10-CM | POA: Insufficient documentation

## 2017-02-28 ENCOUNTER — Encounter: Payer: Self-pay | Admitting: Obstetrics and Gynecology

## 2017-03-12 ENCOUNTER — Encounter: Payer: Self-pay | Admitting: Obstetrics and Gynecology

## 2017-03-12 ENCOUNTER — Ambulatory Visit (INDEPENDENT_AMBULATORY_CARE_PROVIDER_SITE_OTHER): Payer: Self-pay | Admitting: Obstetrics and Gynecology

## 2017-03-12 VITALS — BP 137/78 | HR 64 | Ht 64.0 in | Wt 175.1 lb

## 2017-03-12 DIAGNOSIS — Z9889 Other specified postprocedural states: Secondary | ICD-10-CM

## 2017-03-12 NOTE — Progress Notes (Signed)
HPI:      [name] is a 66 y.o. G2P2003 who LMP was No LMP recorded. Patient is postmenopausal.  Subjective:   She presents today For her final postop check after surgery. She is doing well. She has occasional left lower quadrant pain with certain behaviors like lifting or vacuuming but is otherwise doing well. She reports a complete resolution of her constipation issue. She is approximately 1 year overdue for her Pap smear and mammogram. She has some insurance issues and that she is self pay for preventative medicine. She reports that she has never had an abnormal Pap smear and has been married to the same person for 45 years. She would like to delay her annual examination until next year and she would like to delay her mammography for 3 months. I have told her that she should contact us when she is ready for her mammogram and she says she will do this.    Hx: The following portions of the patient's history were reviewed and updated as appropriate:              She  has a past medical history of Allergy; Arthritis; Diverticulitis; GERD (gastroesophageal reflux disease); HNP (herniated nucleus pulposus with myelopathy), thoracic; and UTI (lower urinary tract infection). She  has a past surgical history that includes Tubal ligation; Refractive surgery; Colonoscopy; Lumbar laminectomy/decompression microdiscectomy (Left, 12/21/2015); Back surgery (12/2015); Eye surgery (Bilateral); and Oophorectomy. Her family history includes Alzheimer's disease in her mother; Cancer in her father; Diverticulitis in her mother, sister, and sister. She  reports that she has never smoked. She has never used smokeless tobacco. She reports that she does not drink alcohol or use drugs. Current Outpatient Prescriptions on File Prior to Visit  Medication Sig Dispense Refill  . aspirin 81 MG chewable tablet Chew 81 mg by mouth daily.     . cetirizine (ZYRTEC) 10 MG tablet Take 10 mg by mouth daily.      .  cholecalciferol (VITAMIN D) 1000 units tablet Take 1,000 Units by mouth daily.    Marland Kitchen estradiol (ESTRACE) 0.1 MG/GM vaginal cream Place 1.61 Applicatorfuls vaginally at bedtime. 90 g 3  . folic acid (FOLVITE) 096 MCG tablet Take 800 mcg by mouth daily.     . Glucosamine 500 MG CAPS Take 1 capsule by mouth daily.      Marland Kitchen Lysine 500 MG TABS Take 1 tablet by mouth daily.    . multivitamin-lutein (OCUVITE-LUTEIN) CAPS capsule Take 1 capsule by mouth daily.    . Omega-3 Fatty Acids (FISH OIL) 1000 MG CAPS Take 1 capsule by mouth daily.    . Probiotic Product (ALIGN) 4 MG CAPS Take 1 capsule by mouth daily.    . vitamin B-12 (CYANOCOBALAMIN) 1000 MCG tablet Take 1,000 mcg by mouth daily.     No current facility-administered medications on file prior to visit.          Review of Systems:  Review of Systems  Constitutional: Denied constitutional symptoms, night sweats, recent illness, fatigue, fever, insomnia and weight loss.  Eyes: Denied eye symptoms, eye pain, photophobia, vision change and visual disturbance.  Ears/Nose/Throat/Neck: Denied ear, nose, throat or neck symptoms, hearing loss, nasal discharge, sinus congestion and sore throat.  Cardiovascular: Denied cardiovascular symptoms, arrhythmia, chest pain/pressure, edema, exercise intolerance, orthopnea and palpitations.  Respiratory: Denied pulmonary symptoms, asthma, pleuritic pain, productive sputum, cough, dyspnea and wheezing.  Gastrointestinal: Denied, gastro-esophageal reflux, melena, nausea and vomiting.  Genitourinary: Denied genitourinary symptoms including  symptomatic vaginal discharge, pelvic relaxation issues, and urinary complaints.  Musculoskeletal: Denied musculoskeletal symptoms, stiffness, swelling, muscle weakness and myalgia.  Dermatologic: Denied dermatology symptoms, rash and scar.  Neurologic: Denied neurology symptoms, dizziness, headache, neck pain and syncope.  Psychiatric: Denied psychiatric symptoms, anxiety and  depression.  Endocrine: Denied endocrine symptoms including hot flashes and night sweats.   Meds:   Current Outpatient Prescriptions on File Prior to Visit  Medication Sig Dispense Refill  . aspirin 81 MG chewable tablet Chew 81 mg by mouth daily.     . cetirizine (ZYRTEC) 10 MG tablet Take 10 mg by mouth daily.      . cholecalciferol (VITAMIN D) 1000 units tablet Take 1,000 Units by mouth daily.    Marland Kitchen estradiol (ESTRACE) 0.1 MG/GM vaginal cream Place 0.38 Applicatorfuls vaginally at bedtime. 90 g 3  . folic acid (FOLVITE) 882 MCG tablet Take 800 mcg by mouth daily.     . Glucosamine 500 MG CAPS Take 1 capsule by mouth daily.      Marland Kitchen Lysine 500 MG TABS Take 1 tablet by mouth daily.    . multivitamin-lutein (OCUVITE-LUTEIN) CAPS capsule Take 1 capsule by mouth daily.    . Omega-3 Fatty Acids (FISH OIL) 1000 MG CAPS Take 1 capsule by mouth daily.    . Probiotic Product (ALIGN) 4 MG CAPS Take 1 capsule by mouth daily.    . vitamin B-12 (CYANOCOBALAMIN) 1000 MCG tablet Take 1,000 mcg by mouth daily.     No current facility-administered medications on file prior to visit.     Objective:     Vitals:   03/12/17 1456  BP: 137/78  Pulse: 64              Abdominal examination reveals some pain left 4 quadrant to deep palpation but it is not significantly reproducible. There is no incisional pain. All incisions are healing well. There is no rebound or guarding.  Assessment:    G2P2003 Patient Active Problem List   Diagnosis Date Noted  . HNP (herniated nucleus pulposus), lumbar 12/21/2015  . Stress reaction 11/26/2011  . ACUTE ETHMOIDAL SINUSITIS 08/31/2010  . URINARY FREQUENCY 08/31/2010  . COLD SORE 10/21/2009  . RUQ PAIN 12/03/2008  . UTI 07/22/2008  . BACK PAIN 01/07/2008  . PLANTAR FASCIITIS 01/07/2008  . ALLERGIC RHINITIS 06/18/2007  . OVARIAN CYST 06/18/2007  . DIVERTICULITIS, HX OF 06/18/2007     1. Post-operative state     Patient doing well postop. 2.  She would  like to delay her annual examination and mammography for a short time to get her insurance and better order.   Plan:            1.  Patient will contact us when she wants her mammogram. I have asked her to present for an annual examination and mammogram within the next year. Orders No orders of the defined types were placed in this encounter.   No orders of the defined types were placed in this encounter.       F/U  Return in about 1 year (around 03/12/2018) for Annual Physical. I spent 16 minutes with this patient of which greater than 50% was spent discussing her surgery, her abdominal pain, her general screening tests including mammography and Pap smear.  Finis Bud, M.D. 03/12/2017 3:47 PM

## 2017-04-19 ENCOUNTER — Telehealth: Payer: Self-pay | Admitting: Obstetrics and Gynecology

## 2017-04-19 ENCOUNTER — Telehealth: Payer: MEDICAID | Admitting: Family

## 2017-04-19 DIAGNOSIS — N390 Urinary tract infection, site not specified: Secondary | ICD-10-CM

## 2017-04-19 MED ORDER — CEPHALEXIN 500 MG PO CAPS: 500.0000 mg | ORAL_CAPSULE | Freq: Two times a day (BID) | ORAL | 0 refills | Status: DC

## 2017-04-19 NOTE — Progress Notes (Signed)
Thank you for the details you put in the comment boxes. Those details really help [place] take better care of you. The accuracy of a home nitrite and leukocyte test strip is questionable and lab equipment (machine) would be a more reliable test. I would not take the test seriously as we cannot verify the quality of it and I would encourage you to reconsider purchasing these test strips in the future. It very well could be correct, but it may or may not be. Regardless, you meet all of the symptoms for a UTI and we will treat you for it.   We are sorry that you are not feeling well.  Here is how we plan to help!  Based on what you shared with me it looks like you most likely have a simple urinary tract infection.  A UTI (Urinary Tract Infection) is a bacterial infection of the bladder.  Most cases of urinary tract infections are simple to treat but a key part of your care is to encourage you to drink plenty of fluids and watch your symptoms carefully.  I have prescribed Keflex 500 mg twice a day for 7 days.  Your symptoms should gradually improve. Call us if the burning in your urine worsens, you develop worsening fever, back pain or pelvic pain or if your symptoms do not resolve after completing the antibiotic.  Urinary tract infections can be prevented by drinking plenty of water to keep your body hydrated.  Also be sure when you wipe, wipe from front to back and don't hold it in!  If possible, empty your bladder every 4 hours.  Your e-visit answers were reviewed by a board certified advanced clinical practitioner to complete your personal care plan.  Depending on the condition, your plan could have included both over the counter or prescription medications.  If there is a problem please reply  once you have received a response from your provider.  Your safety is important to [place].  If you have drug allergies check your prescription carefully.    You can use MyChart to ask questions about today's visit,  request a non-urgent call back, or ask for a work or school excuse for 24 hours related to this e-Visit. If it has been greater than 24 hours you will need to follow up with your provider, or enter a new e-Visit to address those concerns.   You will get an e-mail in the next two days asking about your experience.  I hope that your e-visit has been valuable and will speed your recovery. Thank you for using e-visits.

## 2017-04-19 NOTE — Telephone Encounter (Signed)
Patient lvm wanting to hear back from a nurse or Melody, Patient is experiencing "severe" back pain. Patient believes she may have a "cyst on the kidney" or a "UTI" Patient would like to speak with someone first, then schedule an appointment. Please advise.

## 2017-04-19 NOTE — Telephone Encounter (Signed)
Dr Amalia Hailey pt

## 2017-04-22 NOTE — Telephone Encounter (Signed)
Spoke with patient- states she called on call dr. and got a script for UTI - cefalexin 500 mg BID x7 days per pt. Was offered an appointment but declined. States she will wait till the end of the week to see how she feels.

## 2017-04-22 NOTE — Telephone Encounter (Signed)
Can we see if this patient is still having pain?  I didn't see this message until late on Friday evening.  If so, she could potentially be worked in to see [employer] today.

## 2017-05-10 ENCOUNTER — Other Ambulatory Visit: Payer: Self-pay

## 2017-07-10 ENCOUNTER — Other Ambulatory Visit: Payer: Self-pay | Admitting: Obstetrics and Gynecology

## 2017-07-10 DIAGNOSIS — Z1231 Encounter for screening mammogram for malignant neoplasm of breast: Secondary | ICD-10-CM

## 2017-07-25 ENCOUNTER — Encounter: Payer: Self-pay | Admitting: Obstetrics and Gynecology

## 2017-07-25 ENCOUNTER — Ambulatory Visit (INDEPENDENT_AMBULATORY_CARE_PROVIDER_SITE_OTHER): Payer: Self-pay | Admitting: Obstetrics and Gynecology

## 2017-07-25 VITALS — BP 144/74 | HR 60 | Temp 98.6°F | Ht 64.0 in | Wt 177.3 lb

## 2017-07-25 DIAGNOSIS — N3001 Acute cystitis with hematuria: Secondary | ICD-10-CM

## 2017-07-25 LAB — POCT URINALYSIS DIPSTICK
BILIRUBIN UA: NEGATIVE
GLUCOSE UA: NEGATIVE
KETONES UA: NEGATIVE
Leukocytes, UA: NEGATIVE
Nitrite, UA: NEGATIVE
Protein, UA: NEGATIVE
Urobilinogen, UA: 0.2 E.U./dL
pH, UA: 7 (ref 5.0–8.0)

## 2017-07-25 MED ORDER — NITROFURANTOIN MONOHYD MACRO 100 MG PO CAPS: 100.0000 mg | ORAL_CAPSULE | Freq: Two times a day (BID) | ORAL | 0 refills | Status: AC

## 2017-07-25 NOTE — Progress Notes (Signed)
I now want another day part of the day: Negative For referral again. Cherry nitrates weekends she is onto the cervix 1 unintended Other side this week. The mother through Sunday next week on both [place] HPI:      Katelyn Rivera is a 66 y.o. [phone] who LMP was No LMP recorded. Patient is postmenopausal.  Subjective:   She presents today With complaint of urinary frequency and burning and urgency. She has also noted blood in her urine. She has had previous UTIs.    Hx: The following portions of the patient's history were reviewed and updated as appropriate:             She  has a past medical history of Allergy; Arthritis; Diverticulitis; GERD (gastroesophageal reflux disease); HNP (herniated nucleus pulposus with myelopathy), thoracic; and UTI (lower urinary tract infection). She  does not have any pertinent problems on file. She  has a past surgical history that includes Tubal ligation; Refractive surgery; Colonoscopy; Lumbar laminectomy/decompression microdiscectomy (Left, 12/21/2015); Back surgery (12/2015); Eye surgery (Bilateral); and Oophorectomy. Her family history includes Alzheimer's disease in her mother; Cancer in her father; Diverticulitis in her mother, sister, and sister. She  reports that she has never smoked. She has never used smokeless tobacco. She reports that she does not drink alcohol or use drugs. She has a current medication list which includes the following prescription(s): aspirin, cetirizine, cholecalciferol, estradiol, folic acid, glucosamine, lysine, mometasone, multivitamin-lutein, fish oil, align, vitamin b-12, cephalexin, and nitrofurantoin (macrocrystal-monohydrate). She is allergic to tetracycline.       Review of Systems:  Review of Systems  Constitutional: Denied constitutional symptoms, night sweats, recent illness, fatigue, fever, insomnia and weight loss.  Eyes: Denied eye symptoms, eye pain, photophobia, vision change and visual disturbance.   Ears/Nose/Throat/Neck: Denied ear, nose, throat or neck symptoms, hearing loss, nasal discharge, sinus congestion and sore throat.  Cardiovascular: Denied cardiovascular symptoms, arrhythmia, chest pain/pressure, edema, exercise intolerance, orthopnea and palpitations.  Respiratory: Denied pulmonary symptoms, asthma, pleuritic pain, productive sputum, cough, dyspnea and wheezing.  Gastrointestinal: Denied, gastro-esophageal reflux, melena, nausea and vomiting.  Genitourinary: See HPI for additional information.  Musculoskeletal: Denied musculoskeletal symptoms, stiffness, swelling, muscle weakness and myalgia.  Dermatologic: Denied dermatology symptoms, rash and scar.  Neurologic: Denied neurology symptoms, dizziness, headache, neck pain and syncope.  Psychiatric: Denied psychiatric symptoms, anxiety and depression.  Endocrine: Denied endocrine symptoms including hot flashes and night sweats.   Meds:   Current Outpatient Prescriptions on File Prior to Visit  Medication Sig Dispense Refill  . aspirin 81 MG chewable tablet Chew 81 mg by mouth daily.     . cetirizine (ZYRTEC) 10 MG tablet Take 10 mg by mouth daily.      . cholecalciferol (VITAMIN D) 1000 units tablet Take 1,000 Units by mouth daily.    Marland Kitchen estradiol (ESTRACE) 0.1 MG/GM vaginal cream Place 9.50 Applicatorfuls vaginally at bedtime. 90 g 3  . folic acid (FOLVITE) 932 MCG tablet Take 800 mcg by mouth daily.     . Glucosamine 500 MG CAPS Take 1 capsule by mouth daily.      Marland Kitchen Lysine 500 MG TABS Take 1 tablet by mouth daily.    . multivitamin-lutein (OCUVITE-LUTEIN) CAPS capsule Take 1 capsule by mouth daily.    . Omega-3 Fatty Acids (FISH OIL) 1000 MG CAPS Take 1 capsule by mouth daily.    . Probiotic Product (ALIGN) 4 MG CAPS Take 1 capsule by mouth daily.    . vitamin B-12 (CYANOCOBALAMIN)  1000 MCG tablet Take 1,000 mcg by mouth daily.    . cephALEXin (KEFLEX) 500 MG capsule Take 1 capsule (500 mg total) by mouth 2 (two) times  daily. (Patient not taking: Reported on 07/25/2017) 14 capsule 0   No current facility-administered medications on file prior to visit.     Objective:     Vitals:   07/25/17 1143  BP: (!) 144/74  Pulse: 60  Temp: 98.6 F (37 C)              UA consistent with UTI with hematuria.  Assessment:    G2P2003 Patient Active Problem List   Diagnosis Date Noted  . HNP (herniated nucleus pulposus), lumbar 12/21/2015  . Stress reaction 11/26/2011  . ACUTE ETHMOIDAL SINUSITIS 08/31/2010  . URINARY FREQUENCY 08/31/2010  . COLD SORE 10/21/2009  . RUQ PAIN 12/03/2008  . UTI 07/22/2008  . BACK PAIN 01/07/2008  . PLANTAR FASCIITIS 01/07/2008  . ALLERGIC RHINITIS 06/18/2007  . OVARIAN CYST 06/18/2007  . DIVERTICULITIS, HX OF 06/18/2007     1. Acute cystitis with hematuria        Plan:            1.  We have discussed UTIs in detail. Strategies to decrease occurrence discussed. Use of AZO discussed.  2. Urine for C&S.   Orders Orders Placed This Encounter  Procedures  . POCT urinalysis dipstick     Meds ordered this encounter  Medications  . nitrofurantoin, macrocrystal-monohydrate, (MACROBID) 100 MG capsule    Sig: Take 1 capsule (100 mg total) by mouth 2 (two) times daily.    Dispense:  20 capsule    Refill:  0      F/U  Return for Pt to contact us if symptoms worsen, Annual Physical. I spent 15 minutes with this patient of which greater than 50% was spent discussing UTIs, frequency and recurrence, strategies to prevent, antibiotic use.  Finis Bud, M.D. 07/25/2017 11:59 AM

## 2017-07-27 LAB — URINE CULTURE

## 2017-07-29 ENCOUNTER — Ambulatory Visit
Admission: RE | Admit: 2017-07-29 | Discharge: 2017-07-29 | Disposition: A | Discharge status: Home / Self Care | Payer: Self-pay | Source: Ambulatory Visit | Attending: Obstetrics and Gynecology | Admitting: Obstetrics and Gynecology

## 2017-07-29 ENCOUNTER — Telehealth: Payer: Self-pay

## 2017-07-29 DIAGNOSIS — Z1231 Encounter for screening mammogram for malignant neoplasm of breast: Secondary | ICD-10-CM

## 2017-07-29 MED ORDER — SULFAMETHOXAZOLE-TRIMETHOPRIM 800-160 MG PO TABS: 1.0000 | ORAL_TABLET | Freq: Two times a day (BID) | ORAL | 0 refills | Status: DC

## 2017-07-29 NOTE — Telephone Encounter (Signed)
Spoke with pt- informed her of change in antibiotic. Per pt- [facility]-sent and confirmation received

## 2017-07-30 ENCOUNTER — Other Ambulatory Visit: Payer: Self-pay | Admitting: Obstetrics and Gynecology

## 2017-07-30 DIAGNOSIS — R928 Other abnormal and inconclusive findings on diagnostic imaging of breast: Secondary | ICD-10-CM

## 2017-07-30 DIAGNOSIS — N6489 Other specified disorders of breast: Secondary | ICD-10-CM

## 2017-08-06 ENCOUNTER — Ambulatory Visit
Admission: RE | Admit: 2017-08-06 | Discharge: 2017-08-06 | Disposition: A | Discharge status: Home / Self Care | Payer: Self-pay | Source: Ambulatory Visit | Attending: Obstetrics and Gynecology | Admitting: Obstetrics and Gynecology

## 2017-08-06 DIAGNOSIS — R928 Other abnormal and inconclusive findings on diagnostic imaging of breast: Secondary | ICD-10-CM

## 2017-08-06 DIAGNOSIS — N6489 Other specified disorders of breast: Secondary | ICD-10-CM | POA: Insufficient documentation

## 2017-11-19 DIAGNOSIS — M1990 Unspecified osteoarthritis, unspecified site: Secondary | ICD-10-CM | POA: Insufficient documentation

## 2018-02-06 ENCOUNTER — Ambulatory Visit: Payer: Self-pay | Admitting: Primary Care

## 2018-02-06 ENCOUNTER — Encounter: Payer: Self-pay | Admitting: Primary Care

## 2018-02-06 VITALS — BP 130/78 | HR 69 | Temp 97.9°F | Ht 64.0 in | Wt 180.8 lb

## 2018-02-06 DIAGNOSIS — L409 Psoriasis, unspecified: Secondary | ICD-10-CM

## 2018-02-06 DIAGNOSIS — N952 Postmenopausal atrophic vaginitis: Secondary | ICD-10-CM | POA: Insufficient documentation

## 2018-02-06 DIAGNOSIS — G8929 Other chronic pain: Secondary | ICD-10-CM

## 2018-02-06 DIAGNOSIS — M545 Low back pain, unspecified: Secondary | ICD-10-CM

## 2018-02-06 LAB — POC URINALSYSI DIPSTICK (AUTOMATED)
Bilirubin, UA: NEGATIVE
Blood, UA: NEGATIVE
Glucose, UA: NEGATIVE
Ketones, UA: NEGATIVE
LEUKOCYTES UA: NEGATIVE
Nitrite, UA: NEGATIVE
PH UA: 6 (ref 5.0–8.0)
PROTEIN UA: NEGATIVE
SPEC GRAV UA: 1.01 (ref 1.010–1.025)
UROBILINOGEN UA: 0.2 U/dL

## 2018-02-06 NOTE — Progress Notes (Signed)
Subjective:    Patient ID: Katelyn Rivera, female    DOB: Mar 10, 1951, 67 y.o.   MRN: [id number]  HPI  Katelyn Rivera is a 67 year old female who presents today to establish care and discuss the problems mentioned below. Will obtain old records. Her last physical was Summer of 2018.   1) Vaginal Atrophy: Currently managed on estrace vaginal cream vaginal cream for which she uses once to twice weekly.   2) Chronic Back Pain: Located to right lower back with radiation across her back for the past one month. History of lumbar laminectomy/decompression microdiscectomy in 2017. Increased pain to right lower back over the last one week. She does have chronic pain to her right groin and lower extremity since prior to her surgery. She is working with a Restaurant manager, fast food, saw them last week and has an appointment next week. She denies urinary frequency, dysuria, hematuria, pelvic pain, fevers, numbness/tingling to her lower extremities.    3) Psoriasis: Small patches located to extremities, trunk, vaginal area. Currently using mometasone cream PRN. This was provided by her dermatologist in the past.   Review of Systems  Respiratory: Negative for shortness of breath.   Cardiovascular: Negative for chest pain.  Genitourinary:       Vaginal atrophy/dryness  Musculoskeletal:       Acute on chronic back pain  Skin:       History of psoriasis        Past Medical History:  Diagnosis Date  . Allergy    allergic rhinitis  . Arthritis   . Diverticulitis    Hx of  . GERD (gastroesophageal reflux disease)   . HNP (herniated nucleus pulposus with myelopathy), thoracic   . Ovarian cyst   . UTI (lower urinary tract infection)      Social History   Socioeconomic History  . Marital status: Married    Spouse name: Not on file  . Number of children: Not on file  . Years of education: Not on file  . Highest education level: Not on file  Occupational History  . Not on file  Social Needs  . Financial  resource strain: Not on file  . Food insecurity:    Worry: Not on file    Inability: Not on file  . Transportation needs:    Medical: Not on file    Non-medical: Not on file  Tobacco Use  . Smoking status: Never Smoker  . Smokeless tobacco: Never Used  Substance and Sexual Activity  . Alcohol use: No  . Drug use: No  . Sexual activity: Yes    Birth control/protection: None, Surgical  Lifestyle  . Physical activity:    Days per week: Not on file    Minutes per session: Not on file  . Stress: Not on file  Relationships  . Social connections:    Talks on phone: Not on file    Gets together: Not on file    Attends religious service: Not on file    Active member of club or organization: Not on file    Attends meetings of clubs or organizations: Not on file    Relationship status: Not on file  . Intimate partner violence:    Fear of current or ex partner: Not on file    Emotionally abused: Not on file    Physically abused: Not on file    Forced sexual activity: Not on file  Other Topics Concern  . Not on file  Social History Narrative  Married.   3 children, 5 grandchildren.   Works as a Clinical cytogeneticist.   Enjoys going to the beach.     Past Surgical History:  Procedure Laterality Date  . COLONOSCOPY    . EYE SURGERY Bilateral    LASIK EYE SURGERY  . LUMBAR LAMINECTOMY/DECOMPRESSION MICRODISCECTOMY Left 12/21/2015   Procedure: Left lumbar four-fivelumbar laminotomy and microdiscectomy;  Surgeon: Jovita Gamma, MD;  Location: [facility];  Service: Neurosurgery;  Laterality: Left;  . OOPHORECTOMY    . REFRACTIVE SURGERY    . TUBAL LIGATION      Family History  Problem Relation Age of Onset  . Diverticulitis Mother   . Alzheimer's disease Mother   . Cancer Father        brain tumor  . Diverticulitis Sister   . Diverticulitis Sister     Allergies  Allergen Reactions  . Tetracycline     REACTION: rash    Current Outpatient Medications on File Prior to Visit    Medication Sig Dispense Refill  . aspirin 81 MG chewable tablet Chew 81 mg by mouth daily.     . cetirizine (ZYRTEC) 10 MG tablet Take 10 mg by mouth daily.      . cholecalciferol (VITAMIN D) 1000 units tablet Take 1,000 Units by mouth daily.    Marland Kitchen estradiol (ESTRACE) 0.1 MG/GM vaginal cream Place 6.29 Applicatorfuls vaginally at bedtime. 90 g 3  . folic acid (FOLVITE) 476 MCG tablet Take 800 mcg by mouth daily.     . Glucosamine 500 MG CAPS Take 1 capsule by mouth daily.      Marland Kitchen Lysine 500 MG TABS Take 1 tablet by mouth daily.    . mometasone (ELOCON) 0.1 % cream Apply topically.    . multivitamin-lutein (OCUVITE-LUTEIN) CAPS capsule Take 1 capsule by mouth daily.    . Omega-3 Fatty Acids (FISH OIL) 1000 MG CAPS Take 1 capsule by mouth daily.    . Probiotic Product (ALIGN) 4 MG CAPS Take 1 capsule by mouth daily.    . vitamin B-12 (CYANOCOBALAMIN) 1000 MCG tablet Take 1,000 mcg by mouth daily.     No current facility-administered medications on file prior to visit.     BP 130/78   Pulse 69   Temp 97.9 F (36.6 C) (Oral)   Ht 5\' 4"  (1.626 m)   Wt 180 lb 12 oz (82 kg)   SpO2 97%   BMI 31.03 kg/m    Objective:   Physical Exam  Constitutional: She appears well-nourished.  Neck: Neck supple.  Cardiovascular: Normal rate and regular rhythm.  Pulmonary/Chest: Effort normal and breath sounds normal.  Abdominal: There is no CVA tenderness.  Musculoskeletal:       Lumbar back: She exhibits pain. She exhibits normal range of motion, no tenderness and no bony tenderness.       Back:  Skin: Skin is warm and dry.  Psychiatric: She has a normal mood and affect.          Assessment & Plan:

## 2018-02-06 NOTE — Assessment & Plan Note (Addendum)
Located to right lower back for the past one month. Working with Restaurant manager, fast food, continue same. Given history of recurrent UTI, urine checked today.  UA: Negative Exam today overall unremarkable.

## 2018-02-06 NOTE — Assessment & Plan Note (Signed)
Using mometasone cream intermittently, continue same.

## 2018-02-06 NOTE — Assessment & Plan Note (Signed)
Managed on estrace vaginal cream 1-2 times weekly. Continue same.

## 2018-02-06 NOTE — Patient Instructions (Signed)
Continue following with the chiropractor.  Please schedule a physical with me sometime this Spring or Summer at your convenience. You may also schedule a lab only appointment 3-4 days prior. We will discuss your lab results in detail during your physical.  It was a pleasure to meet you today! Please don't hesitate to call or message me with any questions. Welcome to [facility]!

## 2018-02-12 ENCOUNTER — Encounter: Payer: Self-pay | Admitting: Primary Care

## 2018-02-13 ENCOUNTER — Ambulatory Visit (INDEPENDENT_AMBULATORY_CARE_PROVIDER_SITE_OTHER)
Admission: RE | Admit: 2018-02-13 | Discharge: 2018-02-13 | Disposition: A | Discharge status: Home / Self Care | Payer: Self-pay | Source: Ambulatory Visit | Attending: Primary Care | Admitting: Primary Care

## 2018-02-13 ENCOUNTER — Other Ambulatory Visit: Payer: Self-pay | Admitting: Primary Care

## 2018-02-13 DIAGNOSIS — R319 Hematuria, unspecified: Secondary | ICD-10-CM

## 2018-02-13 LAB — POC URINALSYSI DIPSTICK (AUTOMATED)
Bilirubin, UA: NEGATIVE
Clarity, UA: NEGATIVE
Glucose, UA: NEGATIVE
Ketones, UA: NEGATIVE
LEUKOCYTES UA: NEGATIVE
NITRITE UA: NEGATIVE
PH UA: 6 (ref 5.0–8.0)
PROTEIN UA: NEGATIVE
Spec Grav, UA: 1.015 (ref 1.010–1.025)
UROBILINOGEN UA: 0.2 U/dL

## 2018-02-13 NOTE — Addendum Note (Signed)
Addended by: Jacqualin Combes on: 02/13/2018 09:22 AM   Modules accepted: Orders

## 2018-02-14 ENCOUNTER — Telehealth: Payer: Self-pay

## 2018-02-14 DIAGNOSIS — R109 Unspecified abdominal pain: Secondary | ICD-10-CM

## 2018-02-14 LAB — URINE CULTURE
MICRO NUMBER: 90388398
SPECIMEN QUALITY:: ADEQUATE

## 2018-02-14 NOTE — Telephone Encounter (Signed)
Noted , working on this. See referral message-ah

## 2018-02-14 NOTE — Telephone Encounter (Signed)
Noted, orders placed. Will cc Anastasiya.

## 2018-02-14 NOTE — Telephone Encounter (Signed)
Copied from [place] [id number]. Topic: General - Other >> Feb 14, 2018  9:10 AM Carolyn Stare wrote:  Pt call and wanted to let Alma Friendly know that she would like to have the CT scan  Phone number [phone]

## 2018-02-14 NOTE — Telephone Encounter (Signed)
See imaging tab; pt had DG Abd on 02/13/18 and Gentry Fitz NP wanted to know if pt would have CT renal study.

## 2018-02-15 ENCOUNTER — Other Ambulatory Visit: Payer: Self-pay | Admitting: Primary Care

## 2018-02-15 DIAGNOSIS — N3001 Acute cystitis with hematuria: Secondary | ICD-10-CM

## 2018-02-15 MED ORDER — CEPHALEXIN 500 MG PO CAPS: 500.0000 mg | ORAL_CAPSULE | Freq: Two times a day (BID) | ORAL | 0 refills | Status: AC

## 2018-02-16 ENCOUNTER — Encounter: Payer: Self-pay | Admitting: Primary Care

## 2018-02-17 ENCOUNTER — Ambulatory Visit
Admission: RE | Admit: 2018-02-17 | Discharge: 2018-02-17 | Disposition: A | Discharge status: Home / Self Care | Payer: No Typology Code available for payment source | Source: Ambulatory Visit | Attending: Primary Care | Admitting: Primary Care

## 2018-02-17 DIAGNOSIS — R109 Unspecified abdominal pain: Secondary | ICD-10-CM

## 2018-02-28 ENCOUNTER — Encounter: Payer: Self-pay | Admitting: Primary Care

## 2018-02-28 ENCOUNTER — Telehealth: Payer: Self-pay | Admitting: Primary Care

## 2018-02-28 DIAGNOSIS — R1032 Left lower quadrant pain: Secondary | ICD-10-CM

## 2018-02-28 NOTE — Telephone Encounter (Signed)
Patient called [phone], left detailed VM to return the call to the office to speak with a triage nurse about the flare of diverticulitis.

## 2018-02-28 NOTE — Telephone Encounter (Signed)
Copied from [place] [id number]. Topic: Quick Communication - See Telephone Encounter >> Feb 28, 2018  2:02 PM Boyd Kerbs wrote: CRM for notification.   Pt. Is at beach and is having flare up of diverticulitis.   Would like to speak to Allie Bossier  [facility] [place], [place]   See Telephone encounter for: 02/28/18.

## 2018-03-05 MED ORDER — AMOXICILLIN-POT CLAVULANATE 875-125 MG PO TABS: 1.0000 | ORAL_TABLET | Freq: Two times a day (BID) | ORAL | 0 refills | Status: DC

## 2018-03-25 ENCOUNTER — Ambulatory Visit: Payer: Self-pay

## 2018-03-25 NOTE — Telephone Encounter (Signed)
Pt calling with c/o hematuria and left flank pain. Pt states that she has been seeing pink tinged color to her panty liner and occasionally when she wipes. She has seen blood in her urine 2 times today. Denies fever or pain with urination. Pt states she is passing urine more frequently than usual.  Pt stated that she usually has back pain but is usually locate to the right side.  Pt states that she has completed 2 rounds of Abx per chart review-(1 for UTI 1 for diverticulitis) Care advice given. Appointment made for pt tomorrow. Reason for Disposition . Blood in urine  (Exception: could be normal menstrual bleeding) . Side (flank) or back pain present  Answer Assessment - Initial Assessment Questions 1. COLOR of URINE: "Describe the color of the urine."  (e.g., tea-colored, pink, red, blood clots, bloody)    Bloody on panty liner and more when wiping 2. ONSET: "When did the bleeding start?"      today 3. EPISODES: "How many times has there been blood in the urine?" or "How many times today?"     2 times 4. PAIN with URINATION: "Is there any pain with passing your urine?" If so, ask: "How bad is the pain?"  (Scale 1-10; or mild, moderate, severe)    - MILD - complains slightly about urination hurting    - MODERATE - interferes with normal activities      - SEVERE - excruciating, unwilling or unable to urinate because of the pain      No   5. FEVER: "Do you have a fever?" If so, ask: "What is your temperature, how was it measured, and when did it start?"    no 6. ASSOCIATED SYMPTOMS: "Are you passing urine more frequently than usual?"     yes 7. OTHER SYMPTOMS: "Do you have any other symptoms?" (e.g., back/flank pain, abdominal pain, vomiting) Back or flank pain pt states she has back problems preexisting- usually has back problems focused to the right side but this pain is more on the left side 8. PREGNANCY: "Is there any chance you are pregnant?" "When was your last menstrual period?"  n/a  Protocols used: URINE - BLOOD IN-A-AH

## 2018-03-26 ENCOUNTER — Ambulatory Visit (INDEPENDENT_AMBULATORY_CARE_PROVIDER_SITE_OTHER): Payer: Self-pay | Admitting: Family Medicine

## 2018-03-26 ENCOUNTER — Encounter: Payer: Self-pay | Admitting: Family Medicine

## 2018-03-26 VITALS — BP 152/84 | HR 69 | Temp 98.1°F | Ht 64.0 in | Wt 178.8 lb

## 2018-03-26 DIAGNOSIS — R319 Hematuria, unspecified: Secondary | ICD-10-CM

## 2018-03-26 DIAGNOSIS — N9089 Other specified noninflammatory disorders of vulva and perineum: Secondary | ICD-10-CM

## 2018-03-26 DIAGNOSIS — K5900 Constipation, unspecified: Secondary | ICD-10-CM

## 2018-03-26 LAB — POC URINALSYSI DIPSTICK (AUTOMATED)
BILIRUBIN UA: NEGATIVE
Blood, UA: NEGATIVE
GLUCOSE UA: NEGATIVE
KETONES UA: NEGATIVE
LEUKOCYTES UA: NEGATIVE
Nitrite, UA: NEGATIVE
Protein, UA: NEGATIVE
Spec Grav, UA: <=1.005 — AB (ref 1.010–1.025)
Urobilinogen, UA: 0.2 E.U./dL
pH, UA: 6 (ref 5.0–8.0)

## 2018-03-26 NOTE — Progress Notes (Signed)
Subjective:    Patient ID: Katelyn Rivera, female    DOB: 1951-11-05, 67 y.o.   MRN: [id number]  HPI This is a 67 yo female who presents today with hematuria x yesterday. Cleared by today.  Wears panty liner for urinary incontinence. Noticed some blood on pad, with wiping. None in toilet bowel. Has been using vaginal estrogen and has chronic vaginal irritation. Uses steroid cream on external genitalia for psoriasis which she uses several times a week x 1-1.5 years.  No fever, mild nausea at end of day. Sexually active 5 days ago.   Has history of diverticulitis. Has chronic constipation. Takes probiotic and small dose of miralax (1/2 teaspoon) and benefiber in the morning and Senekot S at night with some improvement. Has chronic hemorrhoids.   Past Medical History:  Diagnosis Date  . Allergy    allergic rhinitis  . Arthritis   . Diverticulitis    Hx of  . GERD (gastroesophageal reflux disease)   . HNP (herniated nucleus pulposus with myelopathy), thoracic   . Ovarian cyst   . UTI (lower urinary tract infection)    Past Surgical History:  Procedure Laterality Date  . COLONOSCOPY    . EYE SURGERY Bilateral    LASIK EYE SURGERY  . LUMBAR LAMINECTOMY/DECOMPRESSION MICRODISCECTOMY Left 12/21/2015   Procedure: Left lumbar four-fivelumbar laminotomy and microdiscectomy;  Surgeon: Jovita Gamma, MD;  Location: [facility];  Service: Neurosurgery;  Laterality: Left;  . OOPHORECTOMY    . REFRACTIVE SURGERY    . TUBAL LIGATION     Family History  Problem Relation Age of Onset  . Diverticulitis Mother   . Alzheimer's disease Mother   . Cancer Father        brain tumor  . Diverticulitis Sister   . Diverticulitis Sister    Social History   Tobacco Use  . Smoking status: Never Smoker  . Smokeless tobacco: Never Used  Substance Use Topics  . Alcohol use: No  . Drug use: No      Review of Systems    per HPI Objective:   Physical Exam  Constitutional: She is oriented to  person, place, and time. She appears well-developed and well-nourished. No distress.  HENT:  Head: Normocephalic and atraumatic.  Eyes: Conjunctivae are normal.  Cardiovascular: Normal rate.  Pulmonary/Chest: Effort normal.  Abdominal: Soft. She exhibits no distension. There is no tenderness. There is no CVA tenderness.  Genitourinary: Pelvic exam was performed with patient supine. There is tenderness on the right labia. There is tenderness on the left labia.  Genitourinary Comments: Labia very erythematous and mildly swollen. Very tender.No discharge on external genitalia, no lesions. Speculum exam deferred.   Neurological: She is alert and oriented to person, place, and time.  Skin: Skin is warm and dry. She is not diaphoretic.  Psychiatric: She has a normal mood and affect. Her behavior is normal. Judgment and thought content normal.      BP (!) 152/84 (BP Location: Right Arm, Patient Position: Sitting, Cuff Size: Normal)   Pulse 69   Temp 98.1 F (36.7 C) (Oral)   Ht 5\' 4"  (1.626 m)   Wt 178 lb 12 oz (81.1 kg)   SpO2 97%   BMI 30.68 kg/m  Wt Readings from Last 3 Encounters:  03/26/18 178 lb 12 oz (81.1 kg)  02/06/18 180 lb 12 oz (82 kg)  07/25/17 177 lb 5 oz (80.4 kg)   BP Readings from Last 3 Encounters:  03/26/18 (!) 152/84  02/06/18 130/78  07/25/17 (!) 144/74   Results for orders placed or performed in visit on 03/26/18  POCT Urinalysis Dipstick (Automated)  Result Value Ref Range   Color, UA yellow    Clarity, UA clear    Glucose, UA neg    Bilirubin, UA neg    Ketones, UA neg    Spec Grav, UA <=1.005 (A) 1.010 - 1.025   Blood, UA neg    pH, UA 6.0 5.0 - 8.0   Protein, UA neg    Urobilinogen, UA 0.2 0.2 or 1.0 E.U./dL   Nitrite, UA neg    Leukocytes, UA Negative Negative       Assessment & Plan:  1. Hematuria, unspecified type - not sure that urine is source of blood, urinalysis normal today, recent renal CT negative. Will send urine for culture but  suspect that blood from vagina - POCT Urinalysis Dipstick (Automated) - Urine Culture - RTC precautions reviewed  2. Labial irritation - advised her to follow up with gyn and discuss prolonged use of steroid cream and estrogen cream.  3. Constipation, unspecified constipation type - discussed increasing Mira lax and Bene fiber and only using Senekot S sparingly.   Clarene Reamer, FNP-BC   Primary Care at [facility]  03/27/2018 8:55 PM

## 2018-03-26 NOTE — Patient Instructions (Addendum)
Good to see you today  Your urine does not have any blood or bacteria, I am sending it for a culture to see if any bacteria grows in your urine.   I think the blood you saw came from either your external or internal genitals. I would like you to follow up with your gynecologist.   If you develop any fever/chills, vomiting, please follow up.   Increase Benefiber and Miralax to full dose, can try taking at night.   Consider using a natural fiber pad or panty for urinary incontinence.

## 2018-03-27 ENCOUNTER — Encounter: Payer: Self-pay | Admitting: Family Medicine

## 2018-03-27 LAB — URINE CULTURE
MICRO NUMBER: 90561861
SPECIMEN QUALITY: ADEQUATE

## 2018-03-28 MED ORDER — CEPHALEXIN 500 MG PO CAPS: 500.0000 mg | ORAL_CAPSULE | Freq: Two times a day (BID) | ORAL | 0 refills | Status: DC

## 2018-03-28 NOTE — Addendum Note (Signed)
Addended by: Clarene Reamer B on: 03/28/2018 01:18 PM   Modules accepted: Orders

## 2018-04-10 ENCOUNTER — Ambulatory Visit: Payer: Self-pay | Admitting: Obstetrics and Gynecology

## 2018-04-10 ENCOUNTER — Encounter: Payer: Self-pay | Admitting: Obstetrics and Gynecology

## 2018-04-10 VITALS — BP 128/75 | HR 63 | Ht 64.0 in | Wt 178.9 lb

## 2018-04-10 DIAGNOSIS — Z87440 Personal history of urinary (tract) infections: Secondary | ICD-10-CM

## 2018-04-10 DIAGNOSIS — R1031 Right lower quadrant pain: Secondary | ICD-10-CM

## 2018-04-10 DIAGNOSIS — K4090 Unilateral inguinal hernia, without obstruction or gangrene, not specified as recurrent: Secondary | ICD-10-CM

## 2018-04-10 DIAGNOSIS — G8929 Other chronic pain: Secondary | ICD-10-CM

## 2018-04-10 DIAGNOSIS — N904 Leukoplakia of vulva: Secondary | ICD-10-CM

## 2018-04-10 LAB — POCT URINALYSIS DIPSTICK
Bilirubin, UA: NEGATIVE
Glucose, UA: NEGATIVE
KETONES UA: NEGATIVE
NITRITE UA: NEGATIVE
Odor: NEGATIVE
PH UA: 6.5 (ref 5.0–8.0)
PROTEIN UA: NEGATIVE
UROBILINOGEN UA: 0.2 U/dL

## 2018-04-10 MED ORDER — CLOBETASOL PROPIONATE 0.05 % EX CREA: 1.0000 "application " | TOPICAL_CREAM | Freq: Two times a day (BID) | CUTANEOUS | 2 refills | Status: DC

## 2018-04-10 MED ORDER — CLOBETASOL PROPIONATE 0.05 % EX CREA
1.0000 | TOPICAL_CREAM | Freq: Two times a day (BID) | CUTANEOUS | 2 refills | Status: AC
Start: 2018-04-10 — End: 2018-05-10

## 2018-04-10 NOTE — Progress Notes (Signed)
HPI:      [name] is a 67 y.o. [phone] who LMP was No LMP recorded. Patient is postmenopausal.  Subjective:   She presents today  she has multiple issues and complaints that she would like to address today. 1 patient complains of low back pain and groin pain.  Mostly with exertion.  Upon further questioning, walking and adduction are difficult.  This pain occurs daily. 2 she complains of right lower quadrant pain especially after significant exertion. 3 states that she has diverticulosis and is concerned that she has noticed a slight change in her bowel movements which may be consistent with her previous problems with diverticulitis. 4 complains of frequent urinary tract infections with frequency burning etc.  Has been treated several times.  She feels like she has "another one today". 5 complains of significant pain with intercourse mostly in the vulvar area.  Also complains of itching and burning daily on her vulva.  She gives a history of seeing a dermatologist who prescribed a steroid which was the only thing that has ever helped her.  (Of significant note she continues to use Estrace vaginal cream at least twice per week)    Hx: The following portions of the patient's history were reviewed and updated as appropriate:             She  has a past medical history of Allergy, Arthritis, Diverticulitis, GERD (gastroesophageal reflux disease), HNP (herniated nucleus pulposus with myelopathy), thoracic, Ovarian cyst, and UTI (lower urinary tract infection). She does not have any pertinent problems on file. She  has a past surgical history that includes Tubal ligation; Refractive surgery; Colonoscopy; Lumbar laminectomy/decompression microdiscectomy (Left, 12/21/2015); Eye surgery (Bilateral); and Oophorectomy. Her family history includes Alzheimer's disease in her mother; Cancer in her father; Diverticulitis in her mother, sister, and sister. She  reports that she has never smoked. She has  never used smokeless tobacco. She reports that she does not drink alcohol or use drugs. She has a current medication list which includes the following prescription(s): aspirin, cephalexin, cetirizine, cholecalciferol, estradiol, folic acid, glucosamine, lysine, mometasone, multivitamin-lutein, fish oil, align, and vitamin b-12. She is allergic to tetracycline.       Review of Systems:  Review of Systems  Constitutional: Denied constitutional symptoms, night sweats, recent illness, fatigue, fever, insomnia and weight loss.  Eyes: Denied eye symptoms, eye pain, photophobia, vision change and visual disturbance.  Ears/Nose/Throat/Neck: Denied ear, nose, throat or neck symptoms, hearing loss, nasal discharge, sinus congestion and sore throat.  Cardiovascular: Denied cardiovascular symptoms, arrhythmia, chest pain/pressure, edema, exercise intolerance, orthopnea and palpitations.  Respiratory: Denied pulmonary symptoms, asthma, pleuritic pain, productive sputum, cough, dyspnea and wheezing.  Gastrointestinal: Denied, gastro-esophageal reflux, melena, nausea and vomiting.  Genitourinary: See HPI for additional information.  Musculoskeletal: See HPI for additional information.  Dermatologic: Denied dermatology symptoms, rash and scar.  Neurologic: Denied neurology symptoms, dizziness, headache, neck pain and syncope.  Psychiatric: Denied psychiatric symptoms, anxiety and depression.  Endocrine: Denied endocrine symptoms including hot flashes and night sweats.   Meds:   Current Outpatient Medications on File Prior to Visit  Medication Sig Dispense Refill  . aspirin 81 MG chewable tablet Chew 81 mg by mouth daily.     . cephALEXin (KEFLEX) 500 MG capsule Take 1 capsule (500 mg total) by mouth 2 (two) times daily. 14 capsule 0  . cetirizine (ZYRTEC) 10 MG tablet Take 10 mg by mouth daily.      . cholecalciferol (VITAMIN D)  1000 units tablet Take 1,000 Units by mouth daily.    Marland Kitchen estradiol (ESTRACE)  0.1 MG/GM vaginal cream Place vaginally.    . folic acid (FOLVITE) 595 MCG tablet Take 800 mcg by mouth daily.     . Glucosamine 500 MG CAPS Take 1 capsule by mouth daily.      Marland Kitchen Lysine 500 MG TABS Take 1 tablet by mouth daily.    . mometasone (ELOCON) 0.1 % cream Apply topically.    . multivitamin-lutein (OCUVITE-LUTEIN) CAPS capsule Take 1 capsule by mouth daily.    . Omega-3 Fatty Acids (FISH OIL) 1000 MG CAPS Take 1 capsule by mouth daily.    . Probiotic Product (ALIGN) 4 MG CAPS Take 1 capsule by mouth daily.    . vitamin B-12 (CYANOCOBALAMIN) 1000 MCG tablet Take 1,000 mcg by mouth daily.     No current facility-administered medications on file prior to visit.     Objective:     Vitals:   04/10/18 0940  BP: 128/75  Pulse: 63              Abdominal examination reveals right lower quadrant pain likely significant for small inguinal hernia.  Pain is worse with Valsalva.  Pain is localized to abdominal wall. Physical examination   Pelvic:   Vulva:  Some appearance of skin thinning and loss of architecture likely consistent with lichen sclerosus.  Palpation causes pulling stretching "pain"  Vagina: No lesions or abnormalities noted.  Support: Normal pelvic support.  Urethra No masses tenderness or scarring.  Meatus Normal size without lesions or prolapse.  Cervix: Normal appearance.  No lesions.  Anus: Normal exam.  No lesions.  Perineum: Normal exam.  No lesions.        Bimanual   Uterus: Normal size.  Non-tender.  Mobile.  AV.  Adnexae: No masses.  Non-tender to palpation.  Cul-de-sac: Negative for abnormality.   UA likely consistent with UTI.  Assessment:    G2P2003 Patient Active Problem List   Diagnosis Date Noted  . Vaginal atrophy 02/06/2018  . Psoriasis 02/06/2018  . Chronic back pain 12/21/2015  . ALLERGIC RHINITIS 06/18/2007     1. Lichen sclerosus et atrophicus of the vulva   2. Inguinal hernia of right side without obstruction or gangrene   3. Chronic  groin pain, right   4. History of recurrent UTIs     I believe her low back pain and groin pain is likely consistent with arthritic changes of her right hip.  I advised her to see orthopedics order to obtain hip x-rays for further evaluation. Her right lower quadrant pain is most consistent with a small inguinal hernia at little to no risk of strangulation.  If this becomes worse consider surgical referral. Diverticulosis I have advised her to see her gastroenterologist for further work-up and evaluation. Urine sent for C&S and once I have the sensitivities will prescribe 2-week course of antibiotics.  I have advised her that a urology consult may be necessary if these continue. We will treat lichen sclerosus with clobetasol.    Plan:            1.  See above for assessment and plan together. Orders No orders of the defined types were placed in this encounter.   No orders of the defined types were placed in this encounter.     F/U  Return in about 6 weeks (around 05/22/2018). I spent 28 minutes involved in the care of this patient of which greater  than 50% was spent discussing the above 5 issues noted in subjective and assessment and plan. Finis Bud, M.D. 04/10/2018 11:56 AM

## 2018-04-10 NOTE — Progress Notes (Signed)
Pt stated that her vaginal area is really dry with painful sex. Marland Kitchen

## 2018-04-12 LAB — URINE CULTURE

## 2018-04-18 ENCOUNTER — Telehealth: Payer: Self-pay

## 2018-04-18 MED ORDER — NITROFURANTOIN MONOHYD MACRO 100 MG PO CAPS: 100.0000 mg | ORAL_CAPSULE | Freq: Two times a day (BID) | ORAL | 0 refills | Status: DC

## 2018-04-18 NOTE — Telephone Encounter (Signed)
-----   Message from [name], MD sent at 04/18/2018  9:44 AM EDT ----- Donella Stade , Results are c/w UTI - needs Rx: MacroBid 1 PO BID for 14 days Thanks

## 2018-04-24 IMAGING — CT CT ABD-PELV W/ CM
2 of 5 series · 15 of 46 positions shown, 17 images · IV contrast (APPLIED)
Comparison: 12/07/2008

CLINICAL DATA: Burning abdominal pain.  Dysuria.

EXAM:
CT ABDOMEN AND PELVIS WITH CONTRAST
TECHNIQUE: Multidetector CT imaging of the abdomen and pelvis was performed
using the standard protocol following bolus administration of
intravenous contrast.
CONTRAST:  100mL [id number] IOPAMIDOL ([id number]) INJECTION 61%

[Series 2: axial st · axial · 0.67mm/px · z∈[-1036,-596]mm · 12 of 100 slices shown, 14 images]
[im 6/100  soft-tissue]
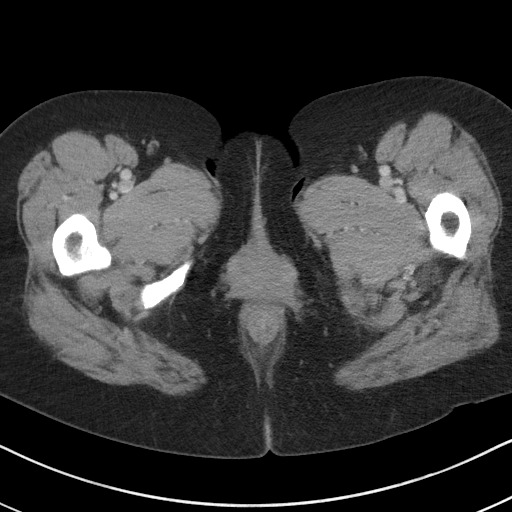
[im 6/100  bone]
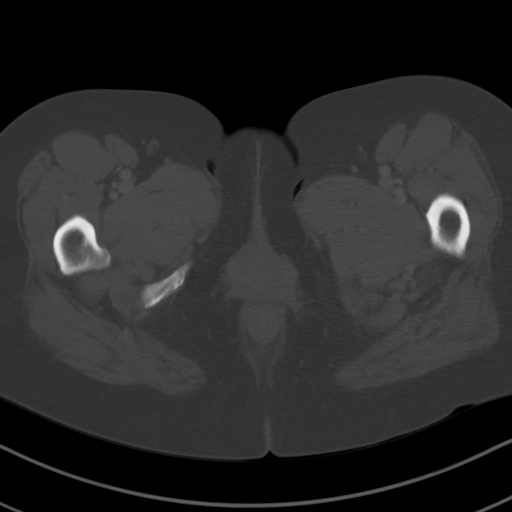
[im 16/100  soft-tissue]
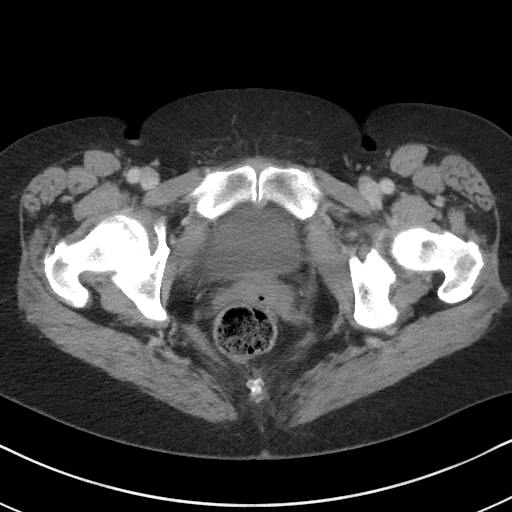
[im 21/100  soft-tissue]
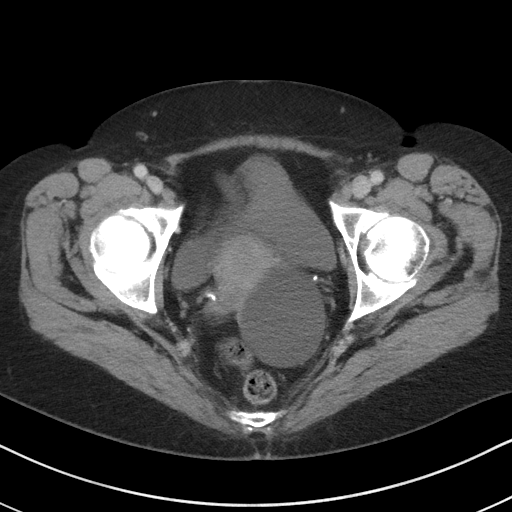
[im 32/100  soft-tissue]
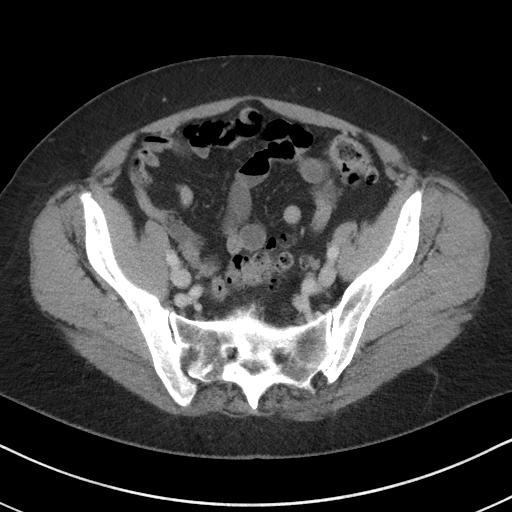
[im 37/100  soft-tissue]
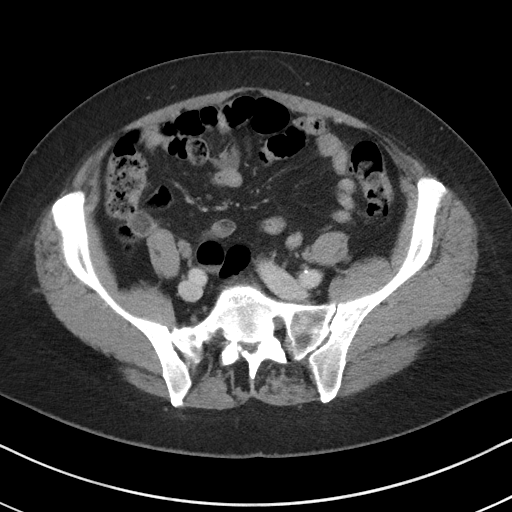
[im 47/100  soft-tissue]
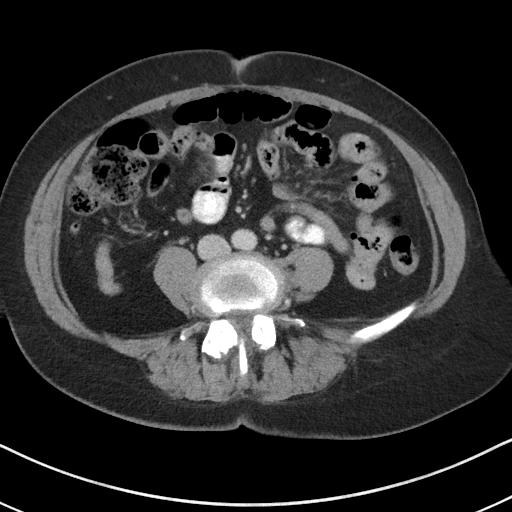
[im 53/100  soft-tissue]
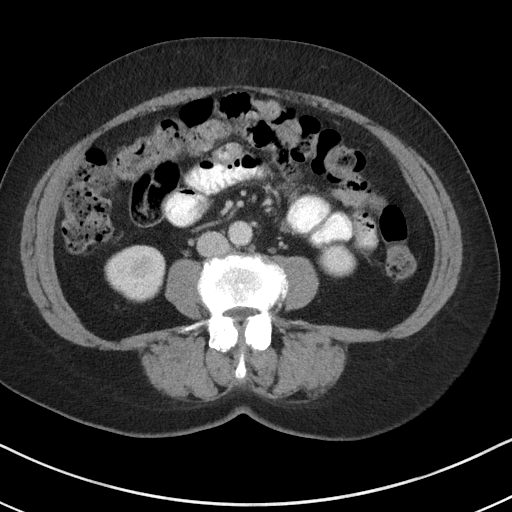
[im 63/100  soft-tissue]
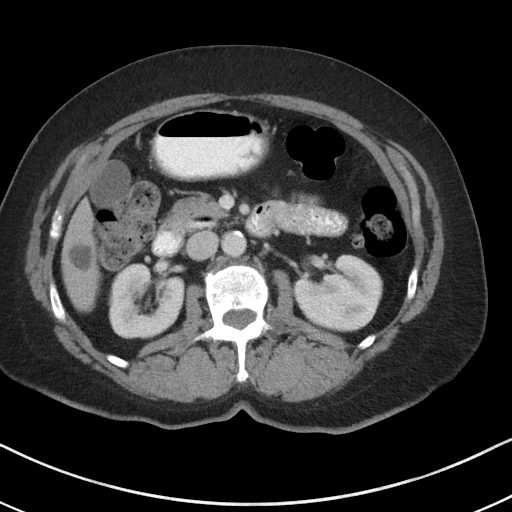
[im 68/100  soft-tissue]
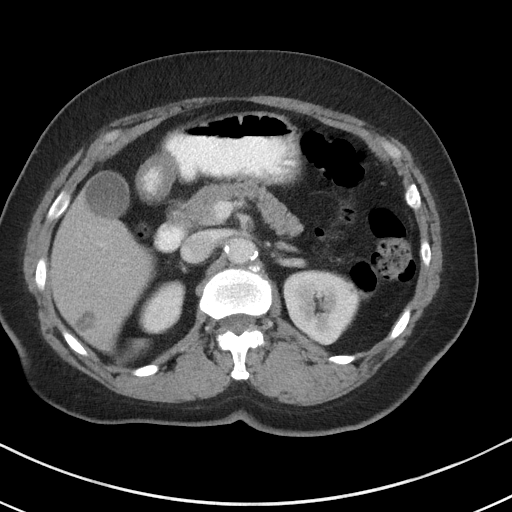
[im 68/100  bone]
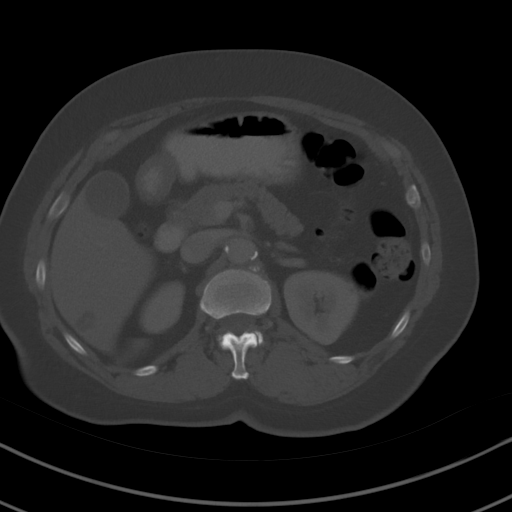
[im 79/100  soft-tissue]
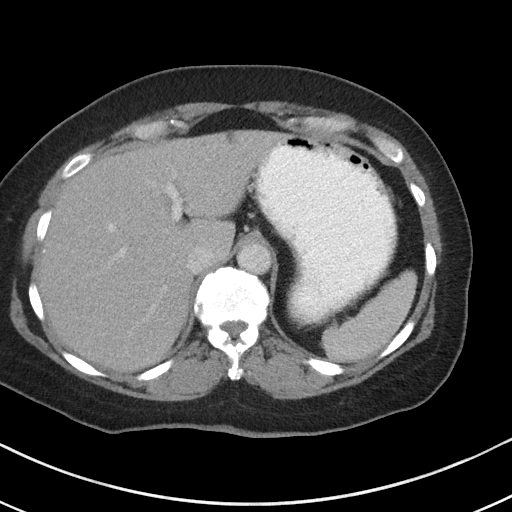
[im 84/100  soft-tissue]
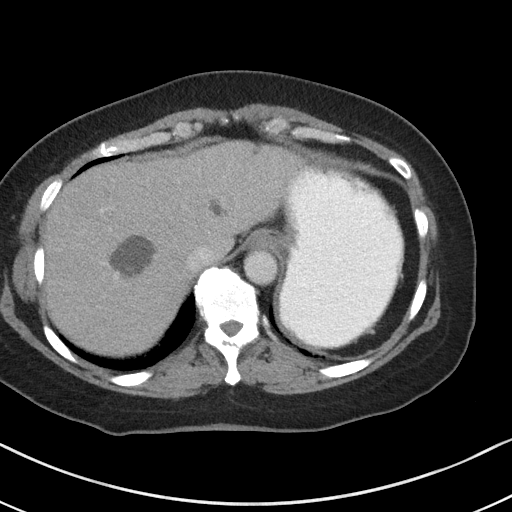
[im 94/100  soft-tissue]
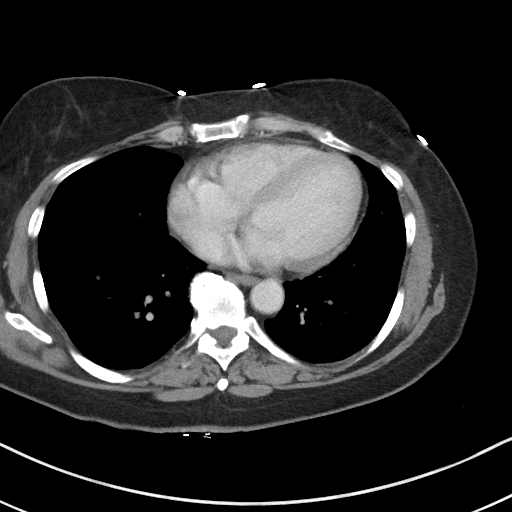

[Series 5: coronal st · coronal · 0.75mm/px · 3 of 101 slices shown]
[im 34/101  soft-tissue]
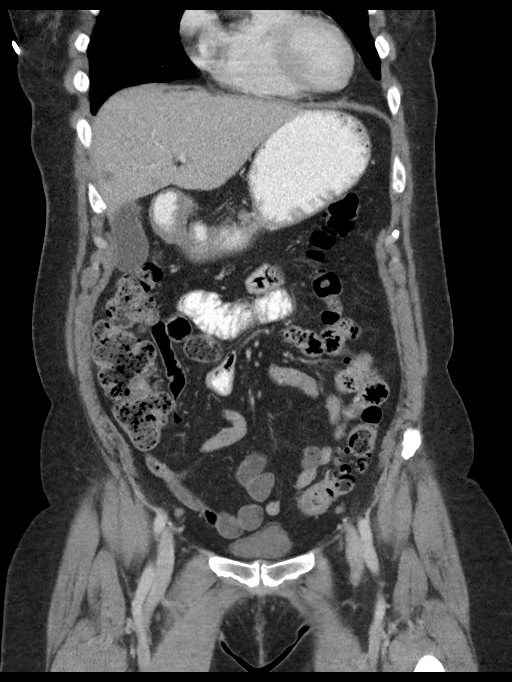
[im 45/101  soft-tissue]
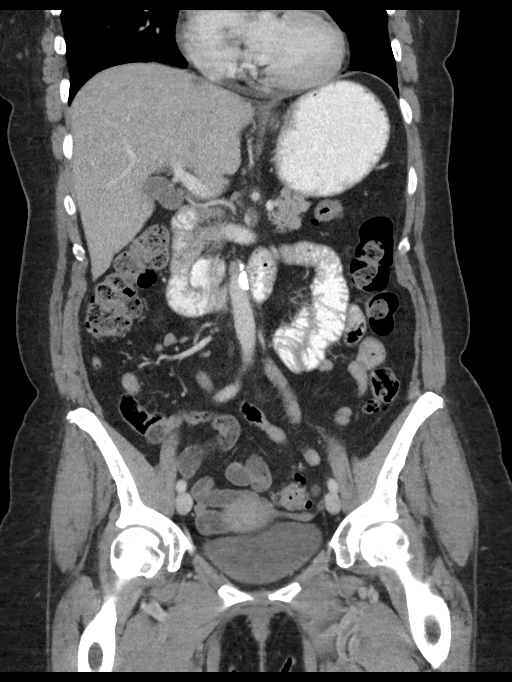
[im 56/101  soft-tissue]
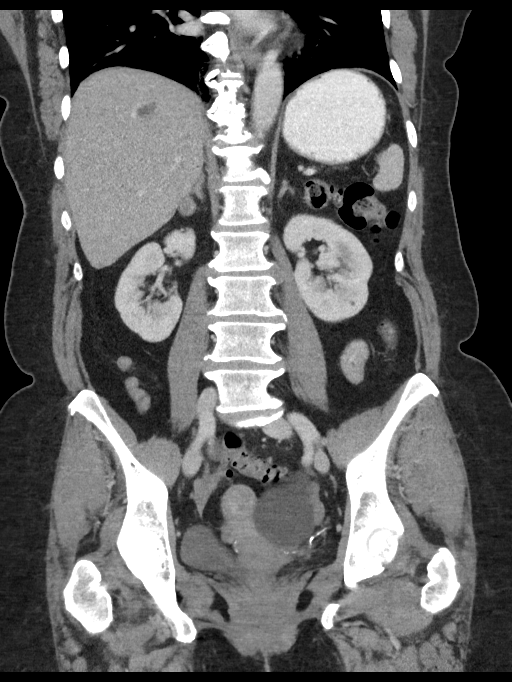

[15 of 46 positions shown; findings below may reference images not displayed]

FINDINGS: Lower chest:  No contributory findings.

Hepatobiliary: Multiple hepatic cysts. Previously cystic density
lesion in the right liver tip has significantly contracted and
calcified since prior. No suspicious liver lesion.No evidence of
biliary obstruction or stone.

Pancreas: Unremarkable.

Spleen: Unremarkable.

Adrenals/Urinary Tract: 15 mm right adrenal nodule is essentially
stable and consistent with adenoma. No hydronephrosis or stone.
Unremarkable bladder.

Stomach/Bowel: Extensive sigmoid diverticulosis. No active
inflammation. Moderate stool volume. No obstruction. No
appendicitis. Duodenal diverticulum.

Vascular/Lymphatic: No acute vascular abnormality. Prominent and
irregular plaque at the SMA proximally, likely with flow limiting.
The celiac is patent. No mass or adenopathy.

Reproductive:7.5 cm cyst from the left ovary which is new from
prior. Simple CT appearance but further characterization is
required. Fibroid uterus with decreased mass size and enhancement
compared to preoperative study, likely related to interval hormonal
changes. Largest visible fibroid is intramural at the fundus
measuring 24 mm.

Other: No ascites or pneumoperitoneum.

Musculoskeletal: Congenitally narrow spinal canal with diffuse facet
arthropathy and spondylosis.
IMPRESSION: 1. No definitive explanation for symptoms.
2. Extensive sigmoid diverticulosis without active inflammation.
Stool volume is moderate.
3. 7.5 cm left ovarian cyst, new from 4040. Recommend nonemergent
pelvic ultrasound.
4. Large atheromatous plaque at the proximal SMA, likely flow
limiting.
5. Uterine fibroids which are diminished since [DATE]. Multiple hepatic cysts as described.

## 2018-05-26 ENCOUNTER — Encounter: Payer: Self-pay | Admitting: Obstetrics and Gynecology

## 2018-05-26 ENCOUNTER — Ambulatory Visit (INDEPENDENT_AMBULATORY_CARE_PROVIDER_SITE_OTHER): Payer: PPO | Admitting: Obstetrics and Gynecology

## 2018-05-26 VITALS — BP 168/98 | HR 73 | Ht 64.0 in | Wt 181.0 lb

## 2018-05-26 DIAGNOSIS — N904 Leukoplakia of vulva: Secondary | ICD-10-CM | POA: Diagnosis not present

## 2018-05-26 DIAGNOSIS — Z87440 Personal history of urinary (tract) infections: Secondary | ICD-10-CM

## 2018-05-26 MED ORDER — SULFAMETHOXAZOLE-TRIMETHOPRIM 800-160 MG PO TABS: 1.0000 | ORAL_TABLET | Freq: Once | ORAL | 1 refills | Status: AC

## 2018-05-26 NOTE — Progress Notes (Signed)
HPI:      [name] is a 67 y.o. [phone] who LMP was No LMP recorded. Patient is postmenopausal.  Subjective:   She presents today stating that her vulvar issues are significantly improved with the combination of clobetasol and vaginal estradiol.  She would like to continue the twice weekly clobetasol She also states that she has been able to identify the fact that her UTIs all occur postcoital. She has done nothing further about her hernia or her possible hip arthritis.    Hx: The following portions of the patient's history were reviewed and updated as appropriate:             She  has a past medical history of Allergy, Arthritis, Diverticulitis, GERD (gastroesophageal reflux disease), HNP (herniated nucleus pulposus with myelopathy), thoracic, Ovarian cyst, and UTI (lower urinary tract infection). She does not have any pertinent problems on file. She  has a past surgical history that includes Tubal ligation; Refractive surgery; Colonoscopy; Lumbar laminectomy/decompression microdiscectomy (Left, 12/21/2015); Eye surgery (Bilateral); and Oophorectomy. Her family history includes Alzheimer's disease in her mother; Cancer in her father; Diverticulitis in her mother, sister, and sister. She  reports that she has never smoked. She has never used smokeless tobacco. She reports that she does not drink alcohol or use drugs. She has a current medication list which includes the following prescription(s): aspirin, cholecalciferol, clobetasol ointment, estradiol, folic acid, glucosamine, multivitamin-lutein, align, valacyclovir, vitamin b-12, and sulfamethoxazole-trimethoprim. She is allergic to tetracycline.       Review of Systems:  Review of Systems  Constitutional: Denied constitutional symptoms, night sweats, recent illness, fatigue, fever, insomnia and weight loss.  Eyes: Denied eye symptoms, eye pain, photophobia, vision change and visual disturbance.  Ears/Nose/Throat/Neck: Denied ear,  nose, throat or neck symptoms, hearing loss, nasal discharge, sinus congestion and sore throat.  Cardiovascular: Denied cardiovascular symptoms, arrhythmia, chest pain/pressure, edema, exercise intolerance, orthopnea and palpitations.  Respiratory: Denied pulmonary symptoms, asthma, pleuritic pain, productive sputum, cough, dyspnea and wheezing.  Gastrointestinal: Denied, gastro-esophageal reflux, melena, nausea and vomiting.  Genitourinary: Denied genitourinary symptoms including symptomatic vaginal discharge, pelvic relaxation issues, and urinary complaints.  Musculoskeletal: Denied musculoskeletal symptoms, stiffness, swelling, muscle weakness and myalgia.  Dermatologic: Denied dermatology symptoms, rash and scar.  Neurologic: Denied neurology symptoms, dizziness, headache, neck pain and syncope.  Psychiatric: Denied psychiatric symptoms, anxiety and depression.  Endocrine: Denied endocrine symptoms including hot flashes and night sweats.   Meds:   Current Outpatient Medications on File Prior to Visit  Medication Sig Dispense Refill  . aspirin 81 MG chewable tablet Chew 81 mg by mouth daily.     . Cholecalciferol ([employer] D3) 2000 units CAPS     . clobetasol ointment (TEMOVATE) 6.96 % Apply 1 application topically 2 (two) times daily.    Marland Kitchen estradiol (ESTRACE) 0.1 MG/GM vaginal cream Place vaginally.    . folic acid (FOLVITE) 295 MCG tablet Take 800 mcg by mouth daily.     . Glucosamine 500 MG CAPS Take 1 capsule by mouth daily.      . multivitamin-lutein (OCUVITE-LUTEIN) CAPS capsule Take 1 capsule by mouth daily.    . Probiotic Product (ALIGN) 4 MG CAPS Take 1 capsule by mouth daily.    . valACYclovir (VALTREX) 1000 MG tablet     . vitamin B-12 (CYANOCOBALAMIN) 1000 MCG tablet Take 1,000 mcg by mouth daily.     No current facility-administered medications on file prior to visit.     Objective:  Vitals:   05/26/18 1035  BP: (!) 168/98  Pulse: 73                Assessment:     G2P2003 Patient Active Problem List   Diagnosis Date Noted  . Vaginal atrophy 02/06/2018  . Psoriasis 02/06/2018  . Chronic back pain 12/21/2015  . ALLERGIC RHINITIS 06/18/2007     1. Lichen sclerosus et atrophicus of the vulva   2. History of recurrent UTIs     Patient notes marked improvement with clobetasol twice weekly.   Plan:            1.  Continue clobetasol  2.  Use Bactrim DS postcoitally as prescribed and see if this makes a difference in her UTIs. Orders No orders of the defined types were placed in this encounter.    Meds ordered this encounter  Medications  . sulfamethoxazole-trimethoprim (BACTRIM DS,SEPTRA DS) 800-160 MG tablet    Sig: Take 1 tablet by mouth once for 1 dose. After intercourse.    Dispense:  20 tablet    Refill:  1      F/U  Return for Pt to contact us if symptoms worsen. I spent 18 minutes involved in the care of this patient of which greater than 50% was spent discussing lichen sclerosus and clobetasol use, postcoital UTIs and possible solutions, arthritic hip problems.  Katelyn Rivera, M.D. 05/26/2018 1:50 PM

## 2018-06-24 DIAGNOSIS — M1611 Unilateral primary osteoarthritis, right hip: Secondary | ICD-10-CM | POA: Diagnosis not present

## 2018-06-24 DIAGNOSIS — M545 Low back pain: Secondary | ICD-10-CM | POA: Diagnosis not present

## 2018-07-04 DIAGNOSIS — M25551 Pain in right hip: Secondary | ICD-10-CM | POA: Diagnosis not present

## 2018-07-04 DIAGNOSIS — M545 Low back pain: Secondary | ICD-10-CM | POA: Diagnosis not present

## 2018-07-15 DIAGNOSIS — M545 Low back pain: Secondary | ICD-10-CM | POA: Diagnosis not present

## 2018-07-15 DIAGNOSIS — M25551 Pain in right hip: Secondary | ICD-10-CM | POA: Diagnosis not present

## 2018-07-22 DIAGNOSIS — M25551 Pain in right hip: Secondary | ICD-10-CM | POA: Diagnosis not present

## 2018-07-22 DIAGNOSIS — M545 Low back pain: Secondary | ICD-10-CM | POA: Diagnosis not present

## 2018-07-28 DIAGNOSIS — M25551 Pain in right hip: Secondary | ICD-10-CM | POA: Diagnosis not present

## 2018-07-28 DIAGNOSIS — M545 Low back pain: Secondary | ICD-10-CM | POA: Diagnosis not present

## 2018-07-29 DIAGNOSIS — R109 Unspecified abdominal pain: Secondary | ICD-10-CM | POA: Diagnosis not present

## 2018-07-29 DIAGNOSIS — R300 Dysuria: Secondary | ICD-10-CM | POA: Diagnosis not present

## 2018-07-29 DIAGNOSIS — R030 Elevated blood-pressure reading, without diagnosis of hypertension: Secondary | ICD-10-CM | POA: Diagnosis not present

## 2018-07-29 DIAGNOSIS — R1031 Right lower quadrant pain: Secondary | ICD-10-CM | POA: Diagnosis not present

## 2018-08-04 ENCOUNTER — Telehealth: Payer: Self-pay | Admitting: Obstetrics and Gynecology

## 2018-08-04 NOTE — Telephone Encounter (Signed)
The patient called and stated that she is experiencing severe stabbing pain in her lower right ribcage towards the back. The patient would like to be seen. I informed the pt that Dr. Amalia Hailey was out this week and that a nurse will contact her to confirm if she is okay to see a different provider or if they have specific further instructions to give her. No other information was disclosed. Please advise.

## 2018-08-04 NOTE — Telephone Encounter (Signed)
Discussed pt symptoms and issues. Pt declined vaginal pain, dysuria, cramping and vaginal bleeding. After reviewing chart, Pt was advised to seek care with a PCP. Pt was given information about [facility] and the providers. Pt will call back with anymore questions or if she needs a referral.

## 2018-08-08 ENCOUNTER — Ambulatory Visit (INDEPENDENT_AMBULATORY_CARE_PROVIDER_SITE_OTHER): Payer: PPO | Admitting: Family Medicine

## 2018-08-08 ENCOUNTER — Encounter: Payer: Self-pay | Admitting: Family Medicine

## 2018-08-08 VITALS — BP 148/82 | HR 88 | Temp 98.3°F | Resp 16 | Ht 64.0 in | Wt 179.0 lb

## 2018-08-08 DIAGNOSIS — S29012A Strain of muscle and tendon of back wall of thorax, initial encounter: Secondary | ICD-10-CM | POA: Diagnosis not present

## 2018-08-08 NOTE — Patient Instructions (Signed)
Good to see you today  Continue daily meloxicam, gentle stretching, heating pad   Thoracic Strain Thoracic strain is an injury to the muscles or tendons that attach to the upper back. A strain can be mild or severe. A mild strain may take only 1-2 weeks to heal. A severe strain involves torn muscles or tendons, so it may take 6-8 weeks to heal. Follow these instructions at home:  Rest as needed. Limit your activity as told by your doctor.  If directed, put ice on the injured area: ? Put ice in a plastic bag. ? Place a towel between your skin and the bag. ? Leave the ice on for 20 minutes, 2-3 times per day.  Take over-the-counter and prescription medicines only as told by your doctor.  Begin doing exercises as told by your doctor or physical therapist.  Warm up before being active.  Bend your knees before you lift heavy objects.  Keep all follow-up visits as told by your doctor. This is important. Contact a doctor if:  Your pain is not helped by medicine.  Your pain, bruising, or swelling is getting worse.  You have a fever. Get help right away if:  You have shortness of breath.  You have chest pain.  You have weakness or loss of feeling (numbness) in your legs.  You cannot control when you pee (urinate). This information is not intended to replace advice given to you by your health care provider. Make sure you discuss any questions you have with your health care provider. Document Released: 04/23/2008 Document Revised: 07/07/2016 Document Reviewed: 12/30/2014 Elsevier Interactive Patient Education  [employer].

## 2018-08-08 NOTE — Progress Notes (Signed)
Subjective:    Patient ID: Katelyn Rivera, female    DOB: 1951/11/05, 67 y.o.   MRN: [id number]  HPI This is a 67 yo female who presents today with upper right sided back pain and abdominal pain.  Was seen at [facility] 07/29/18 with 3 days of pain. UA showed rare bacteria. Was put on Cipro 500 mg po BID x 7 days. Culture did not show significant growth. Also given baclofen and tramadol. Patient reports that pain is improved, going from a 10 to current level of 4. Continues to have right hip pain and some difficulty sleeping due to pain on side and hip, both on right side.  Pain with moving in certain ways, especially bending. Prior to pain starting, had been in physical therapy for right hip arthritis. The day that pain started, patient had upper body physical therapy including side stretching and other movements that she rarely does. Pain started within a couple of hours. She is concerned for something going on with longstanding hepatic cysts or her kidneys. She is certain that Cipro helped her pain.  She reports that she has no time to exercise, works multiple jobs, hopes this will improve next year  Past Medical History:  Diagnosis Date  . Allergy    allergic rhinitis  . Arthritis   . Diverticulitis    Hx of  . GERD (gastroesophageal reflux disease)   . HNP (herniated nucleus pulposus with myelopathy), thoracic   . Ovarian cyst   . UTI (lower urinary tract infection)    Past Surgical History:  Procedure Laterality Date  . COLONOSCOPY    . EYE SURGERY Bilateral    LASIK EYE SURGERY  . LUMBAR LAMINECTOMY/DECOMPRESSION MICRODISCECTOMY Left 12/21/2015   Procedure: Left lumbar four-fivelumbar laminotomy and microdiscectomy;  Surgeon: Jovita Gamma, MD;  Location: [place] [facility];  Service: Neurosurgery;  Laterality: Left;  . OOPHORECTOMY    . REFRACTIVE SURGERY    . TUBAL LIGATION     Family History  Problem Relation Age of Onset  . Diverticulitis Mother   . Alzheimer's  disease Mother   . Cancer Father        brain tumor  . Diverticulitis Sister   . Diverticulitis Sister    Social History   Tobacco Use  . Smoking status: Never Smoker  . Smokeless tobacco: Never Used  Substance Use Topics  . Alcohol use: No  . Drug use: No       Review of Systems Per HPI    Objective:   Physical Exam  Constitutional: She is oriented to person, place, and time. She appears well-developed and well-nourished.  Non-toxic appearance. She does not appear ill. No distress.  HENT:  Head: Normocephalic and atraumatic.  Cardiovascular: Normal rate.  Pulmonary/Chest: Effort normal.  Musculoskeletal: She exhibits no edema.       Cervical back: Normal.       Thoracic back: She exhibits tenderness (Right paraspinal tenderness and lower right rib tenderness). She exhibits normal range of motion, no edema and no deformity.  Pain with lateral movement to right. No rash.   Neurological: She is alert and oriented to person, place, and time.  Skin: Skin is warm and dry.  Psychiatric: She has a normal mood and affect. Her behavior is normal. Judgment and thought content normal.  Vitals reviewed.     BP (!) 148/82 (BP Location: Left Arm, Patient Position: Sitting, Cuff Size: Large)   Pulse 88   Temp 98.3 F (36.8 C) (Oral)  Resp 16   Ht 5\' 4"  (1.626 m)   Wt 179 lb (81.2 kg)   SpO2 99%   BMI 30.73 kg/m  BP Readings from Last 3 Encounters:  08/08/18 (!) 148/82  05/26/18 (!) 168/98  04/10/18 128/75   Wt Readings from Last 3 Encounters:  08/08/18 179 lb (81.2 kg)  05/26/18 181 lb (82.1 kg)  04/10/18 178 lb 14.4 oz (81.1 kg)       Assessment & Plan:  1. Strain of thoracic back region - pain improving - seems related to PT session - reviewed lab results and most recent CT scan and discussed with patient- attempt to reassure her - continue current meds, can try heat, encouraged gentle stretching - return precautions reviewed    Clarene Reamer,  FNP-BC  [facility], [place]  08/08/2018 8:27 PM

## 2018-08-11 DIAGNOSIS — M545 Low back pain: Secondary | ICD-10-CM | POA: Diagnosis not present

## 2018-08-11 DIAGNOSIS — M25551 Pain in right hip: Secondary | ICD-10-CM | POA: Diagnosis not present

## 2018-08-19 DIAGNOSIS — M545 Low back pain: Secondary | ICD-10-CM | POA: Diagnosis not present

## 2018-08-19 DIAGNOSIS — M25551 Pain in right hip: Secondary | ICD-10-CM | POA: Diagnosis not present

## 2018-08-22 DIAGNOSIS — Z23 Encounter for immunization: Secondary | ICD-10-CM | POA: Diagnosis not present

## 2018-08-22 DIAGNOSIS — E7800 Pure hypercholesterolemia, unspecified: Secondary | ICD-10-CM | POA: Diagnosis not present

## 2018-08-22 DIAGNOSIS — Z0000 Encounter for general adult medical examination without abnormal findings: Secondary | ICD-10-CM | POA: Diagnosis not present

## 2018-08-22 DIAGNOSIS — Z79899 Other long term (current) drug therapy: Secondary | ICD-10-CM | POA: Diagnosis not present

## 2018-08-22 DIAGNOSIS — Z13820 Encounter for screening for osteoporosis: Secondary | ICD-10-CM | POA: Diagnosis not present

## 2018-08-22 DIAGNOSIS — Z1211 Encounter for screening for malignant neoplasm of colon: Secondary | ICD-10-CM | POA: Diagnosis not present

## 2018-08-22 DIAGNOSIS — M1990 Unspecified osteoarthritis, unspecified site: Secondary | ICD-10-CM | POA: Diagnosis not present

## 2018-08-22 DIAGNOSIS — I10 Essential (primary) hypertension: Secondary | ICD-10-CM | POA: Insufficient documentation

## 2018-08-22 DIAGNOSIS — Z1239 Encounter for other screening for malignant neoplasm of breast: Secondary | ICD-10-CM | POA: Diagnosis not present

## 2018-08-22 DIAGNOSIS — K5790 Diverticulosis of intestine, part unspecified, without perforation or abscess without bleeding: Secondary | ICD-10-CM | POA: Insufficient documentation

## 2018-08-26 DIAGNOSIS — M25551 Pain in right hip: Secondary | ICD-10-CM | POA: Diagnosis not present

## 2018-09-01 ENCOUNTER — Other Ambulatory Visit: Payer: Self-pay | Admitting: Internal Medicine

## 2018-09-01 DIAGNOSIS — Z1231 Encounter for screening mammogram for malignant neoplasm of breast: Secondary | ICD-10-CM

## 2018-09-02 DIAGNOSIS — Z780 Asymptomatic menopausal state: Secondary | ICD-10-CM | POA: Diagnosis not present

## 2018-09-09 DIAGNOSIS — E7800 Pure hypercholesterolemia, unspecified: Secondary | ICD-10-CM | POA: Diagnosis not present

## 2018-09-09 DIAGNOSIS — I10 Essential (primary) hypertension: Secondary | ICD-10-CM | POA: Diagnosis not present

## 2018-09-09 DIAGNOSIS — Z79899 Other long term (current) drug therapy: Secondary | ICD-10-CM | POA: Diagnosis not present

## 2018-09-22 ENCOUNTER — Ambulatory Visit
Admission: RE | Admit: 2018-09-22 | Discharge: 2018-09-22 | Disposition: A | Discharge status: Home / Self Care | Payer: PPO | Source: Ambulatory Visit | Attending: Internal Medicine | Admitting: Internal Medicine

## 2018-09-22 DIAGNOSIS — Z1231 Encounter for screening mammogram for malignant neoplasm of breast: Secondary | ICD-10-CM

## 2018-09-22 DIAGNOSIS — Z1211 Encounter for screening for malignant neoplasm of colon: Secondary | ICD-10-CM | POA: Diagnosis not present

## 2018-12-02 DIAGNOSIS — E7800 Pure hypercholesterolemia, unspecified: Secondary | ICD-10-CM | POA: Diagnosis not present

## 2018-12-02 DIAGNOSIS — I10 Essential (primary) hypertension: Secondary | ICD-10-CM | POA: Diagnosis not present

## 2018-12-02 DIAGNOSIS — R05 Cough: Secondary | ICD-10-CM | POA: Diagnosis not present

## 2018-12-03 DIAGNOSIS — H25813 Combined forms of age-related cataract, bilateral: Secondary | ICD-10-CM | POA: Diagnosis not present

## 2019-01-06 DIAGNOSIS — Z0000 Encounter for general adult medical examination without abnormal findings: Secondary | ICD-10-CM | POA: Diagnosis not present

## 2019-01-06 DIAGNOSIS — H40153 Residual stage of open-angle glaucoma, bilateral: Secondary | ICD-10-CM | POA: Diagnosis not present

## 2019-01-06 DIAGNOSIS — I10 Essential (primary) hypertension: Secondary | ICD-10-CM | POA: Diagnosis not present

## 2019-01-06 DIAGNOSIS — E7800 Pure hypercholesterolemia, unspecified: Secondary | ICD-10-CM | POA: Diagnosis not present

## 2019-01-16 ENCOUNTER — Other Ambulatory Visit: Payer: Self-pay | Admitting: Obstetrics and Gynecology

## 2019-02-05 DIAGNOSIS — R0609 Other forms of dyspnea: Secondary | ICD-10-CM | POA: Diagnosis not present

## 2019-02-05 DIAGNOSIS — R05 Cough: Secondary | ICD-10-CM | POA: Diagnosis not present

## 2019-02-05 DIAGNOSIS — I10 Essential (primary) hypertension: Secondary | ICD-10-CM | POA: Diagnosis not present

## 2019-02-06 ENCOUNTER — Other Ambulatory Visit: Payer: Self-pay | Admitting: Internal Medicine

## 2019-02-06 DIAGNOSIS — R05 Cough: Secondary | ICD-10-CM

## 2019-02-06 DIAGNOSIS — R053 Chronic cough: Secondary | ICD-10-CM

## 2019-02-06 DIAGNOSIS — R0609 Other forms of dyspnea: Secondary | ICD-10-CM

## 2019-02-10 ENCOUNTER — Other Ambulatory Visit: Payer: Self-pay | Admitting: Internal Medicine

## 2019-02-10 DIAGNOSIS — R05 Cough: Secondary | ICD-10-CM

## 2019-02-10 DIAGNOSIS — R053 Chronic cough: Secondary | ICD-10-CM

## 2019-02-10 DIAGNOSIS — R0609 Other forms of dyspnea: Secondary | ICD-10-CM

## 2019-02-23 DIAGNOSIS — I10 Essential (primary) hypertension: Secondary | ICD-10-CM | POA: Diagnosis not present

## 2019-02-23 DIAGNOSIS — H903 Sensorineural hearing loss, bilateral: Secondary | ICD-10-CM | POA: Diagnosis not present

## 2019-02-23 DIAGNOSIS — H6123 Impacted cerumen, bilateral: Secondary | ICD-10-CM | POA: Diagnosis not present

## 2019-02-23 DIAGNOSIS — M25472 Effusion, left ankle: Secondary | ICD-10-CM | POA: Diagnosis not present

## 2019-02-23 DIAGNOSIS — M25471 Effusion, right ankle: Secondary | ICD-10-CM | POA: Diagnosis not present

## 2019-03-03 DIAGNOSIS — I10 Essential (primary) hypertension: Secondary | ICD-10-CM | POA: Diagnosis not present

## 2019-03-03 DIAGNOSIS — M1990 Unspecified osteoarthritis, unspecified site: Secondary | ICD-10-CM | POA: Diagnosis not present

## 2019-03-05 DIAGNOSIS — I10 Essential (primary) hypertension: Secondary | ICD-10-CM | POA: Diagnosis not present

## 2019-03-16 ENCOUNTER — Ambulatory Visit
Admission: RE | Admit: 2019-03-16 | Discharge: 2019-03-16 | Disposition: A | Discharge status: Home / Self Care | Payer: PPO | Source: Ambulatory Visit | Attending: Internal Medicine | Admitting: Internal Medicine

## 2019-03-16 ENCOUNTER — Other Ambulatory Visit: Payer: Self-pay

## 2019-03-16 DIAGNOSIS — R05 Cough: Secondary | ICD-10-CM | POA: Diagnosis not present

## 2019-03-16 DIAGNOSIS — R0609 Other forms of dyspnea: Secondary | ICD-10-CM | POA: Diagnosis not present

## 2019-03-16 DIAGNOSIS — R053 Chronic cough: Secondary | ICD-10-CM

## 2019-03-17 DIAGNOSIS — L578 Other skin changes due to chronic exposure to nonionizing radiation: Secondary | ICD-10-CM | POA: Diagnosis not present

## 2019-03-17 DIAGNOSIS — L821 Other seborrheic keratosis: Secondary | ICD-10-CM | POA: Diagnosis not present

## 2019-03-17 DIAGNOSIS — D2272 Melanocytic nevi of left lower limb, including hip: Secondary | ICD-10-CM | POA: Diagnosis not present

## 2019-03-17 DIAGNOSIS — L820 Inflamed seborrheic keratosis: Secondary | ICD-10-CM | POA: Diagnosis not present

## 2019-03-17 DIAGNOSIS — L570 Actinic keratosis: Secondary | ICD-10-CM | POA: Diagnosis not present

## 2019-03-17 DIAGNOSIS — D485 Neoplasm of uncertain behavior of skin: Secondary | ICD-10-CM | POA: Diagnosis not present

## 2019-03-17 DIAGNOSIS — D1800 Hemangioma unspecified site: Secondary | ICD-10-CM | POA: Diagnosis not present

## 2019-03-17 DIAGNOSIS — I10 Essential (primary) hypertension: Secondary | ICD-10-CM | POA: Diagnosis not present

## 2019-04-16 DIAGNOSIS — H53453 Other localized visual field defect, bilateral: Secondary | ICD-10-CM | POA: Diagnosis not present

## 2019-04-16 DIAGNOSIS — H04129 Dry eye syndrome of unspecified lacrimal gland: Secondary | ICD-10-CM | POA: Diagnosis not present

## 2019-04-16 DIAGNOSIS — H538 Other visual disturbances: Secondary | ICD-10-CM | POA: Diagnosis not present

## 2019-04-16 DIAGNOSIS — H25813 Combined forms of age-related cataract, bilateral: Secondary | ICD-10-CM | POA: Diagnosis not present

## 2019-04-16 DIAGNOSIS — H02403 Unspecified ptosis of bilateral eyelids: Secondary | ICD-10-CM | POA: Diagnosis not present

## 2019-04-16 DIAGNOSIS — H2513 Age-related nuclear cataract, bilateral: Secondary | ICD-10-CM | POA: Diagnosis not present

## 2019-04-16 DIAGNOSIS — H18713 Corneal ectasia, bilateral: Secondary | ICD-10-CM | POA: Diagnosis not present

## 2019-04-16 DIAGNOSIS — H18603 Keratoconus, unspecified, bilateral: Secondary | ICD-10-CM | POA: Diagnosis not present

## 2019-04-16 DIAGNOSIS — Z9889 Other specified postprocedural states: Secondary | ICD-10-CM | POA: Diagnosis not present

## 2019-05-20 DIAGNOSIS — H18713 Corneal ectasia, bilateral: Secondary | ICD-10-CM | POA: Diagnosis not present

## 2019-05-20 DIAGNOSIS — H2513 Age-related nuclear cataract, bilateral: Secondary | ICD-10-CM | POA: Diagnosis not present

## 2019-05-20 DIAGNOSIS — H02403 Unspecified ptosis of bilateral eyelids: Secondary | ICD-10-CM | POA: Diagnosis not present

## 2019-05-20 DIAGNOSIS — H18603 Keratoconus, unspecified, bilateral: Secondary | ICD-10-CM | POA: Diagnosis not present

## 2019-05-20 DIAGNOSIS — Z9889 Other specified postprocedural states: Secondary | ICD-10-CM | POA: Diagnosis not present

## 2019-06-19 DIAGNOSIS — L900 Lichen sclerosus et atrophicus: Secondary | ICD-10-CM | POA: Diagnosis not present

## 2019-06-19 DIAGNOSIS — L400 Psoriasis vulgaris: Secondary | ICD-10-CM | POA: Diagnosis not present

## 2019-06-19 DIAGNOSIS — L820 Inflamed seborrheic keratosis: Secondary | ICD-10-CM | POA: Diagnosis not present

## 2019-06-30 DIAGNOSIS — M1990 Unspecified osteoarthritis, unspecified site: Secondary | ICD-10-CM | POA: Diagnosis not present

## 2019-06-30 DIAGNOSIS — I10 Essential (primary) hypertension: Secondary | ICD-10-CM | POA: Diagnosis not present

## 2019-06-30 DIAGNOSIS — E7800 Pure hypercholesterolemia, unspecified: Secondary | ICD-10-CM | POA: Diagnosis not present

## 2019-06-30 DIAGNOSIS — Z79899 Other long term (current) drug therapy: Secondary | ICD-10-CM | POA: Diagnosis not present

## 2019-07-06 DIAGNOSIS — H2511 Age-related nuclear cataract, right eye: Secondary | ICD-10-CM | POA: Diagnosis not present

## 2019-07-06 DIAGNOSIS — Z01818 Encounter for other preprocedural examination: Secondary | ICD-10-CM | POA: Diagnosis not present

## 2019-07-06 DIAGNOSIS — H2513 Age-related nuclear cataract, bilateral: Secondary | ICD-10-CM | POA: Diagnosis not present

## 2019-07-06 DIAGNOSIS — H2512 Age-related nuclear cataract, left eye: Secondary | ICD-10-CM | POA: Diagnosis not present

## 2019-07-07 DIAGNOSIS — Z20828 Contact with and (suspected) exposure to other viral communicable diseases: Secondary | ICD-10-CM | POA: Diagnosis not present

## 2019-07-07 DIAGNOSIS — H2513 Age-related nuclear cataract, bilateral: Secondary | ICD-10-CM | POA: Diagnosis not present

## 2019-07-07 DIAGNOSIS — Z01812 Encounter for preprocedural laboratory examination: Secondary | ICD-10-CM | POA: Diagnosis not present

## 2019-07-07 DIAGNOSIS — Z01818 Encounter for other preprocedural examination: Secondary | ICD-10-CM | POA: Diagnosis not present

## 2019-07-13 DIAGNOSIS — B009 Herpesviral infection, unspecified: Secondary | ICD-10-CM | POA: Diagnosis not present

## 2019-07-13 DIAGNOSIS — L400 Psoriasis vulgaris: Secondary | ICD-10-CM | POA: Diagnosis not present

## 2019-07-13 DIAGNOSIS — L900 Lichen sclerosus et atrophicus: Secondary | ICD-10-CM | POA: Diagnosis not present

## 2019-07-13 DIAGNOSIS — L820 Inflamed seborrheic keratosis: Secondary | ICD-10-CM | POA: Diagnosis not present

## 2019-07-14 DIAGNOSIS — H25811 Combined forms of age-related cataract, right eye: Secondary | ICD-10-CM | POA: Diagnosis not present

## 2019-07-15 DIAGNOSIS — H18603 Keratoconus, unspecified, bilateral: Secondary | ICD-10-CM | POA: Diagnosis not present

## 2019-07-15 DIAGNOSIS — Z961 Presence of intraocular lens: Secondary | ICD-10-CM | POA: Diagnosis not present

## 2019-07-15 DIAGNOSIS — Z9889 Other specified postprocedural states: Secondary | ICD-10-CM | POA: Diagnosis not present

## 2019-07-15 DIAGNOSIS — H18713 Corneal ectasia, bilateral: Secondary | ICD-10-CM | POA: Diagnosis not present

## 2019-07-15 DIAGNOSIS — Z48810 Encounter for surgical aftercare following surgery on the sense organs: Secondary | ICD-10-CM | POA: Diagnosis not present

## 2019-07-15 DIAGNOSIS — H02403 Unspecified ptosis of bilateral eyelids: Secondary | ICD-10-CM | POA: Diagnosis not present

## 2019-07-15 DIAGNOSIS — H2512 Age-related nuclear cataract, left eye: Secondary | ICD-10-CM | POA: Diagnosis not present

## 2019-07-29 DIAGNOSIS — M5489 Other dorsalgia: Secondary | ICD-10-CM | POA: Diagnosis not present

## 2019-07-29 DIAGNOSIS — R300 Dysuria: Secondary | ICD-10-CM | POA: Diagnosis not present

## 2019-07-30 ENCOUNTER — Other Ambulatory Visit: Payer: Self-pay | Admitting: Internal Medicine

## 2019-07-30 DIAGNOSIS — R3129 Other microscopic hematuria: Secondary | ICD-10-CM

## 2019-07-30 DIAGNOSIS — R109 Unspecified abdominal pain: Secondary | ICD-10-CM

## 2019-08-03 ENCOUNTER — Other Ambulatory Visit: Payer: Self-pay | Admitting: Obstetrics and Gynecology

## 2019-08-03 ENCOUNTER — Other Ambulatory Visit: Payer: Self-pay | Admitting: Internal Medicine

## 2019-08-03 DIAGNOSIS — R1032 Left lower quadrant pain: Secondary | ICD-10-CM

## 2019-08-05 ENCOUNTER — Other Ambulatory Visit: Payer: Self-pay

## 2019-08-05 ENCOUNTER — Ambulatory Visit
Admission: RE | Admit: 2019-08-05 | Discharge: 2019-08-05 | Disposition: A | Discharge status: Home / Self Care | Payer: PPO | Source: Ambulatory Visit | Attending: Internal Medicine | Admitting: Internal Medicine

## 2019-08-05 ENCOUNTER — Other Ambulatory Visit
Admission: RE | Admit: 2019-08-05 | Discharge: 2019-08-05 | Disposition: A | Discharge status: Home / Self Care | Payer: PPO | Source: Ambulatory Visit | Attending: Internal Medicine | Admitting: Internal Medicine

## 2019-08-05 DIAGNOSIS — R1032 Left lower quadrant pain: Secondary | ICD-10-CM | POA: Diagnosis not present

## 2019-08-05 DIAGNOSIS — R109 Unspecified abdominal pain: Secondary | ICD-10-CM | POA: Diagnosis not present

## 2019-08-05 LAB — COMPREHENSIVE METABOLIC PANEL
ALT: 19 U/L (ref 0–44)
AST: 20 U/L (ref 15–41)
Albumin: 4.2 g/dL (ref 3.5–5.0)
Alkaline Phosphatase: 48 U/L (ref 38–126)
Anion gap: 9 (ref 5–15)
BUN: 16 mg/dL (ref 8–23)
CO2: 26 mmol/L (ref 22–32)
Calcium: 9.2 mg/dL (ref 8.9–10.3)
Chloride: 103 mmol/L (ref 98–111)
Creatinine, Ser: 0.76 mg/dL (ref 0.44–1.00)
GFR calc Af Amer: >60 mL/min (ref >60)
GFR calc non Af Amer: >60 mL/min (ref >60)
Glucose, Bld: 97 mg/dL (ref 70–99)
Potassium: 4.3 mmol/L (ref 3.5–5.1)
Sodium: 138 mmol/L (ref 135–145)
Total Bilirubin: 0.7 mg/dL (ref 0.3–1.2)
Total Protein: 7.5 g/dL (ref 6.5–8.1)

## 2019-08-05 MED ORDER — IOHEXOL 300 MG/ML  SOLN
100.0000 mL | Freq: Once | INTRAMUSCULAR | Status: AC | PRN
  Administered 2019-08-05: 100 mL via INTRAVENOUS

## 2019-08-11 ENCOUNTER — Ambulatory Visit: Payer: PPO

## 2019-08-18 DIAGNOSIS — Z79899 Other long term (current) drug therapy: Secondary | ICD-10-CM | POA: Diagnosis not present

## 2019-08-18 DIAGNOSIS — E7800 Pure hypercholesterolemia, unspecified: Secondary | ICD-10-CM | POA: Diagnosis not present

## 2019-08-18 DIAGNOSIS — I10 Essential (primary) hypertension: Secondary | ICD-10-CM | POA: Diagnosis not present

## 2019-08-21 DIAGNOSIS — Z48810 Encounter for surgical aftercare following surgery on the sense organs: Secondary | ICD-10-CM | POA: Diagnosis not present

## 2019-08-21 DIAGNOSIS — Z9889 Other specified postprocedural states: Secondary | ICD-10-CM | POA: Diagnosis not present

## 2019-08-21 DIAGNOSIS — H02403 Unspecified ptosis of bilateral eyelids: Secondary | ICD-10-CM | POA: Diagnosis not present

## 2019-08-21 DIAGNOSIS — Z961 Presence of intraocular lens: Secondary | ICD-10-CM | POA: Diagnosis not present

## 2019-08-21 DIAGNOSIS — H18713 Corneal ectasia, bilateral: Secondary | ICD-10-CM | POA: Diagnosis not present

## 2019-08-26 DIAGNOSIS — I10 Essential (primary) hypertension: Secondary | ICD-10-CM | POA: Diagnosis not present

## 2019-08-26 DIAGNOSIS — Z1231 Encounter for screening mammogram for malignant neoplasm of breast: Secondary | ICD-10-CM | POA: Diagnosis not present

## 2019-08-26 DIAGNOSIS — E7800 Pure hypercholesterolemia, unspecified: Secondary | ICD-10-CM | POA: Diagnosis not present

## 2019-09-01 DIAGNOSIS — L400 Psoriasis vulgaris: Secondary | ICD-10-CM | POA: Diagnosis not present

## 2019-09-01 DIAGNOSIS — L820 Inflamed seborrheic keratosis: Secondary | ICD-10-CM | POA: Diagnosis not present

## 2019-09-01 DIAGNOSIS — L900 Lichen sclerosus et atrophicus: Secondary | ICD-10-CM | POA: Diagnosis not present

## 2019-09-01 DIAGNOSIS — D485 Neoplasm of uncertain behavior of skin: Secondary | ICD-10-CM | POA: Diagnosis not present

## 2019-09-17 DIAGNOSIS — I10 Essential (primary) hypertension: Secondary | ICD-10-CM | POA: Diagnosis not present

## 2019-09-17 DIAGNOSIS — E7800 Pure hypercholesterolemia, unspecified: Secondary | ICD-10-CM | POA: Diagnosis not present

## 2019-10-27 ENCOUNTER — Other Ambulatory Visit: Payer: Self-pay | Admitting: Internal Medicine

## 2019-10-27 DIAGNOSIS — Z1231 Encounter for screening mammogram for malignant neoplasm of breast: Secondary | ICD-10-CM

## 2019-12-01 ENCOUNTER — Other Ambulatory Visit: Payer: Self-pay

## 2019-12-01 ENCOUNTER — Encounter: Payer: Self-pay | Admitting: Obstetrics and Gynecology

## 2019-12-01 ENCOUNTER — Ambulatory Visit (INDEPENDENT_AMBULATORY_CARE_PROVIDER_SITE_OTHER): Payer: PPO | Admitting: Obstetrics and Gynecology

## 2019-12-01 ENCOUNTER — Other Ambulatory Visit (HOSPITAL_COMMUNITY)
Admission: RE | Admit: 2019-12-01 | Discharge: 2019-12-01 | Disposition: A | Discharge status: Home / Self Care | Payer: PPO | Source: Ambulatory Visit | Attending: Obstetrics and Gynecology | Admitting: Obstetrics and Gynecology

## 2019-12-01 VITALS — BP 161/70 | HR 74 | Ht 64.0 in | Wt 181.5 lb

## 2019-12-01 DIAGNOSIS — L900 Lichen sclerosus et atrophicus: Secondary | ICD-10-CM | POA: Diagnosis not present

## 2019-12-01 DIAGNOSIS — N951 Menopausal and female climacteric states: Secondary | ICD-10-CM

## 2019-12-01 DIAGNOSIS — Z01419 Encounter for gynecological examination (general) (routine) without abnormal findings: Secondary | ICD-10-CM | POA: Diagnosis not present

## 2019-12-01 DIAGNOSIS — Z124 Encounter for screening for malignant neoplasm of cervix: Secondary | ICD-10-CM | POA: Diagnosis not present

## 2019-12-01 MED ORDER — ESTRADIOL 0.1 MG/GM VA CREA: 0.2500 | TOPICAL_CREAM | VAGINAL | 1 refills | Status: DC

## 2019-12-01 NOTE — Progress Notes (Signed)
HPI:      [name] is a 69 y.o. G2P2003 who LMP was No LMP recorded. Patient is postmenopausal.  Subjective:   She presents today for her annual examination.  She went to see a dermatologist who prescribed clobetasol in an ointment rather than a cream.  The patient thinks it is working better for her.  She would like to continue using it. She continues to use estrogen vaginal cream and would like to continue. She does complain of vaginal odor with urination and she attributes this to the possibility of recurrent urinary tract infection.  She is otherwise asymptomatic with this. She continues to have some hip pain and groin pain but thinks it may be her back not her hip. She is scheduled for mammography in 1 week.    Hx: The following portions of the patient's history were reviewed and updated as appropriate:             She  has a past medical history of Allergy, Arthritis, Diverticulitis, GERD (gastroesophageal reflux disease), HNP (herniated nucleus pulposus with myelopathy), thoracic, Ovarian cyst, and UTI (lower urinary tract infection). She does not have any pertinent problems on file. She  has a past surgical history that includes Tubal ligation; Refractive surgery; Colonoscopy; Lumbar laminectomy/decompression microdiscectomy (Left, 12/21/2015); Eye surgery (Bilateral); and Oophorectomy (Bilateral). Her family history includes Alzheimer's disease in her mother; Cancer in her father; Diverticulitis in her mother, sister, and sister. She  reports that she has never smoked. She has never used smokeless tobacco. She reports that she does not drink alcohol or use drugs. She has a current medication list which includes the following prescription(s): clobetasol ointment, estradiol, folic acid, glucosamine, multivitamin-lutein, and vitamin b-12. She is allergic to tetracycline.       Review of Systems:  Review of Systems  Constitutional: Denied constitutional symptoms, night sweats,  recent illness, fatigue, fever, insomnia and weight loss.  Eyes: Denied eye symptoms, eye pain, photophobia, vision change and visual disturbance.  Ears/Nose/Throat/Neck: Denied ear, nose, throat or neck symptoms, hearing loss, nasal discharge, sinus congestion and sore throat.  Cardiovascular: Denied cardiovascular symptoms, arrhythmia, chest pain/pressure, edema, exercise intolerance, orthopnea and palpitations.  Respiratory: Denied pulmonary symptoms, asthma, pleuritic pain, productive sputum, cough, dyspnea and wheezing.  Gastrointestinal: Denied, gastro-esophageal reflux, melena, nausea and vomiting.  Genitourinary: See HPI for additional information.  Musculoskeletal: Denied musculoskeletal symptoms, stiffness, swelling, muscle weakness and myalgia.  Dermatologic: Denied dermatology symptoms, rash and scar.  Neurologic: Denied neurology symptoms, dizziness, headache, neck pain and syncope.  Psychiatric: Denied psychiatric symptoms, anxiety and depression.  Endocrine: Denied endocrine symptoms including hot flashes and night sweats.   Meds:   Current Outpatient Medications on File Prior to Visit  Medication Sig Dispense Refill  . clobetasol ointment (TEMOVATE) AB-123456789 % Apply 1 application topically 2 (two) times daily.    Marland Kitchen estradiol (ESTRACE) 0.1 MG/GM vaginal cream PLACE [id number] APPLICATORFULS VAGINALLY AT BEDTIME 43 g 0  . folic acid (FOLVITE) A999333 MCG tablet Take 800 mcg by mouth daily.     . Glucosamine 500 MG CAPS Take 1 capsule by mouth daily.      . multivitamin-lutein (OCUVITE-LUTEIN) CAPS capsule Take 1 capsule by mouth daily.    . vitamin B-12 (CYANOCOBALAMIN) 1000 MCG tablet Take 1,000 mcg by mouth daily.     No current facility-administered medications on file prior to visit.    Objective:     Vitals:   12/01/19 1518  BP: (!) 161/70  Pulse: 74  Physical examination General NAD, Conversant  HEENT Atraumatic; Op clear with mmm.  Normo-cephalic. Pupils  reactive. Anicteric sclerae  Thyroid/Neck Smooth without nodularity or enlargement. Normal ROM.  Neck Supple.  Skin No rashes, lesions or ulceration. Normal palpated skin turgor. No nodularity.  Breasts: No masses or discharge.  Symmetric.  No axillary adenopathy.  Lungs: Clear to auscultation.No rales or wheezes. Normal Respiratory effort, no retractions.  Heart: NSR.  No murmurs or rubs appreciated. No periferal edema  Abdomen: Soft.  Non-tender.  No masses.  No HSM. No hernia  Extremities: Moves all appropriately.  Normal ROM for age. No lymphadenopathy.  Neuro: Oriented to PPT.  Normal mood. Normal affect.     Pelvic:   Vulva:  Some loss of architecture consistent with LS but much improvement in white epithelial change from appearance last year.  Vagina: No lesions or abnormalities noted.  Mild to moderate atrophy noted  Support: Normal pelvic support.  Urethra No masses tenderness or scarring.  Meatus Normal size without lesions or prolapse.  Cervix: Normal appearance.  No lesions.  Anus: Normal exam.  No lesions.  Perineum: Normal exam.  No lesions.        Bimanual   Uterus: Normal size.  Non-tender.  Mobile.  AV.  Adnexae: No masses.  Non-tender to palpation.  Cul-de-sac: Negative for abnormality.   WET PREP: clue cells: absent, KOH (yeast): negative, odor: absent and trichomoniasis: negative Ph:  < 4.5    Assessment:    G2P2003 Patient Active Problem List   Diagnosis Date Noted  . Vaginal atrophy 02/06/2018  . Psoriasis 02/06/2018  . Chronic back pain 12/21/2015  . ALLERGIC RHINITIS 06/18/2007     1. Well woman exam with routine gynecological exam   2. Lichen sclerosus   3. Symptomatic menopausal or female climacteric states     Lichen sclerosus much improved with continued use of clobetasol  Negative wet prep  Patient would like to continue use of vaginal estrogen.     Plan:            1.  Basic Screening Recommendations The basic screening  recommendations for asymptomatic women were discussed with the patient during her visit.  The age-appropriate recommendations were discussed with her and the rational for the tests reviewed.  When I am informed by the patient that another primary care physician has previously obtained the age-appropriate tests and they are up-to-date, only outstanding tests are ordered and referrals given as necessary.  Abnormal results of tests will be discussed with her when all of her results are completed.  Routine preventative health maintenance measures emphasized: Exercise/Diet/Weight control, Tobacco Warnings, Alcohol/Substance use risks and Stress Management Pap performed-if cotest negative consider no further Paps necessary. 2.  Continue vaginal Estrace cream 3.  Recommend patient continue to get her blood pressure under control with her family physician 4.  Continue clobetasol use Orders No orders of the defined types were placed in this encounter.   No orders of the defined types were placed in this encounter.       F/U  Return in about 1 year (around 11/30/2020) for Annual Physical.  Finis Bud, M.D. 12/01/2019 4:10 PM

## 2019-12-08 ENCOUNTER — Ambulatory Visit
Admission: RE | Admit: 2019-12-08 | Discharge: 2019-12-08 | Disposition: A | Discharge status: Home / Self Care | Payer: PPO | Source: Ambulatory Visit | Attending: Internal Medicine | Admitting: Internal Medicine

## 2019-12-08 DIAGNOSIS — Z1231 Encounter for screening mammogram for malignant neoplasm of breast: Secondary | ICD-10-CM | POA: Insufficient documentation

## 2020-03-01 ENCOUNTER — Ambulatory Visit: Payer: PPO | Admitting: Dermatology

## 2020-03-01 ENCOUNTER — Other Ambulatory Visit: Payer: Self-pay

## 2020-03-01 DIAGNOSIS — D1801 Hemangioma of skin and subcutaneous tissue: Secondary | ICD-10-CM | POA: Diagnosis not present

## 2020-03-01 DIAGNOSIS — L900 Lichen sclerosus et atrophicus: Secondary | ICD-10-CM

## 2020-03-01 NOTE — Progress Notes (Signed)
   Follow-Up Visit   Subjective  Katelyn Rivera is a 69 y.o. female who presents for the following: Follow-up.  6 month follow up for LS et A at the inferior labia minora. Patient is using clobetasol ointment 4-5 times weekly and feels like she is well controlled. No flares per patient.  No pain or burning.  She also uses estrogen cream twice weekly. Also following up on itching of the perirectal area. Patient using calcipotriene daily and feels like she is well controlled with no flares. Itching improved.  The following portions of the chart were reviewed this encounter and updated as appropriate:     Review of Systems: No other skin or systemic complaints.  Objective  Well appearing patient in no apparent distress; mood and affect are within normal limits.  A focused examination was performed including groin, buttocks. Relevant physical exam findings are noted in the Assessment and Plan.  Objective  Left Labia Majora: Small red papules x 3  Objective  Inferior Labia Minora, perianal: Mild erythema with telangiectasia at mucosal inf labia minora/inferior introitus, mild dusky erythema perianal, no atrophy  Assessment & Plan  Hemangioma of skin Left Labia Majora  Benign, observe.    Lichen sclerosus et atrophicus Inferior Labia Minora, perianal  Improved- no itching, burning or pain.  Cont clobetasol ointment to affected areas vaginal area 5x/weekly. May decrease to twice weekly if symptoms controlled and increase as needed. She will continue her estrogen cream on the alternate days. Cont calcipotriene daily as needed to perianal area.  Discussed risk of skin atrophy of clobetasol to inguinal skin folds and perianal area.  Return in about 6 months (around 08/31/2020) for LS et A.   Graciella Belton, RMA, am acting as scribe for Brendolyn Patty, MD .

## 2020-03-01 NOTE — Patient Instructions (Signed)
Recommend daily broad spectrum sunscreen SPF 30+ to sun-exposed areas, reapply every 2 hours as needed. Call for new or changing lesions.  Topical steroids (such as triamcinolone, fluocinolone, fluocinonide, mometasone, clobetasol, halobetasol, betamethasone, hydrocortisone) can cause thinning and lightening of the skin if they are used for too long in the same area. Your physician has selected the right strength medicine for your problem and area affected on the body. Please use your medication only as directed by your physician to prevent side effects.   

## 2020-03-16 ENCOUNTER — Other Ambulatory Visit: Payer: Self-pay | Admitting: Podiatry

## 2020-03-16 ENCOUNTER — Ambulatory Visit: Payer: PPO | Admitting: Podiatry

## 2020-03-16 ENCOUNTER — Other Ambulatory Visit: Payer: Self-pay

## 2020-03-16 ENCOUNTER — Encounter: Payer: Self-pay | Admitting: Podiatry

## 2020-03-16 ENCOUNTER — Ambulatory Visit (INDEPENDENT_AMBULATORY_CARE_PROVIDER_SITE_OTHER): Payer: PPO

## 2020-03-16 VITALS — Temp 97.5°F

## 2020-03-16 DIAGNOSIS — M778 Other enthesopathies, not elsewhere classified: Secondary | ICD-10-CM

## 2020-03-16 DIAGNOSIS — M7750 Other enthesopathy of unspecified foot: Secondary | ICD-10-CM

## 2020-03-16 MED ORDER — MELOXICAM 15 MG PO TABS: 15.0000 mg | ORAL_TABLET | Freq: Every day | ORAL | 3 refills | Status: AC

## 2020-03-16 NOTE — Progress Notes (Signed)
Subjective:  Patient ID: Katelyn Rivera, female    DOB: Oct 30, 1951,  MRN: [id number] HPI Chief Complaint  Patient presents with  . Foot Pain    Patient presents today for bilat feet pain x months.  She says her right foot hurts on the top of foot at base of toes and forefoot and on lateral side of foot.  She was unable to describe pain "it just hurts when I walk"  She also says she has random pains in her left foot that are in diffrent areas of her foot.  The pain is worse in mornings and late at  night.  She takes Naproxen for relief    69 y.o. female presents with the above complaint.   ROS: Denies fever chills nausea vomiting muscle aches pains calf pain back pain chest pain shortness of breath.  Past Medical History:  Diagnosis Date  . Allergy    allergic rhinitis  . Arthritis   . Dermatitis    LS et A  . Diverticulitis    Hx of  . Dysplastic nevus 03/08/2015   Excised. Left mid medial buttock  . Dysplastic nevus 06/23/2015   Left lower quadrant abdomen. Mild atypia. Limited margins free  . GERD (gastroesophageal reflux disease)   . HNP (herniated nucleus pulposus with myelopathy), thoracic   . Ovarian cyst   . Psoriasis   . UTI (lower urinary tract infection)    Past Surgical History:  Procedure Laterality Date  . COLONOSCOPY    . EYE SURGERY Bilateral    LASIK EYE SURGERY  . LUMBAR LAMINECTOMY/DECOMPRESSION MICRODISCECTOMY Left 12/21/2015   Procedure: Left lumbar four-fivelumbar laminotomy and microdiscectomy;  Surgeon: Jovita Gamma, MD;  Location: [place] [facility];  Service: Neurosurgery;  Laterality: Left;  . OOPHORECTOMY Bilateral   . REFRACTIVE SURGERY    . TUBAL LIGATION      Current Outpatient Medications:  .  aspirin EC 81 MG tablet, Take 81 mg by mouth daily., Disp: , Rfl:  .  calcipotriene (DOVONOX) 0.005 % cream, APPLY TO AFFECTED AREAS ONCE TO TWICE DAILY FOR PSORIASIS, Disp: , Rfl:  .  hypromellose (GENTEAL) 0.3 % GEL ophthalmic ointment, , Disp: , Rfl:   .  bifidobacterium infantis (ALIGN) capsule, Take by mouth., Disp: , Rfl:  .  clobetasol ointment (TEMOVATE) [id number] %, Apply 1 application topically 2 (two) times daily., Disp: , Rfl:  .  cloNIDine (CATAPRES) 0.1 MG tablet, Take 0.1 mg by mouth 2 (two) times daily., Disp: , Rfl:  .  estradiol (ESTRACE) 0.1 MG/GM vaginal cream, Place [id number] Applicatorfuls vaginally 2 (two) times a week. 1/3 applicator vaginally 2 X per week, Disp: 43 g, Rfl: 1 .  folic acid (FOLVITE) A999333 MCG tablet, Take 800 mcg by mouth daily. , Disp: , Rfl:  .  Glucosamine 500 MG CAPS, Take 1 capsule by mouth daily.  , Disp: , Rfl:  .  meloxicam (MOBIC) 15 MG tablet, Take 1 tablet (15 mg total) by mouth daily., Disp: 30 tablet, Rfl: 3 .  multivitamin-lutein (OCUVITE-LUTEIN) CAPS capsule, Take 1 capsule by mouth daily., Disp: , Rfl:  .  vitamin B-12 (CYANOCOBALAMIN) 1000 MCG tablet, Take 1,000 mcg by mouth daily., Disp: , Rfl:   Allergies  Allergen Reactions  . Tetracycline     REACTION: rash   Review of Systems Objective:   Vitals:   03/16/20 0944  Temp: (!) 97.5 F (36.4 C)    General: Well developed, nourished, in no acute distress, alert and oriented x3  Dermatological: Skin is warm, dry and supple bilateral. Nails x 10 are well maintained; remaining integument appears unremarkable at this time. There are no open sores, no preulcerative lesions, no rash or signs of infection present.  Vascular: Dorsalis Pedis artery and Posterior Tibial artery pedal pulses are 2/4 bilateral with immedate capillary fill time. Pedal hair growth present. No varicosities and no lower extremity edema present bilateral.   Neruologic: Grossly intact via light touch bilateral. Vibratory intact via tuning fork bilateral. Protective threshold with Semmes Wienstein monofilament intact to all pedal sites bilateral. Patellar and Achilles deep tendon reflexes 2+ bilateral. No Babinski or clonus noted bilateral.  No palpable neuromas  noted. Musculoskeletal: No gross boney pedal deformities bilateral. No pain, crepitus, or limitation noted with foot and ankle range of motion bilateral. Muscular strength 5/5 in all groups tested bilateral.  She has pain on end range of motion of the second and third and the fourth metatarsophalangeal joints of the right foot.  She has some pain on lateral palpation of the fifth metatarsal and on palpation of the medial calcaneal tubercles.  Gait: Unassisted, Nonantalgic.    Radiographs:  Radiographs taken today demonstrate no osseous abnormalities osseously mature individual with no acute findings.  Assessment & Plan:   Assessment: Most likely capsulitis resulting in lateral compensation syndrome possibly some early plantar fasciitis.  Plan: Discussed etiology pathology and surgical therapies injected the second and third interdigital spaces today with 10 mg Kenalog 5 mg of Marcaine respectively.  Also discussed if this should work then may need to consider injecting the lateral aspect of foot or the heel if they are still hurting.  Recommended that she get started back on the meloxicam.    Quantina Dershem T. [place], [place]

## 2020-03-23 DIAGNOSIS — R8290 Unspecified abnormal findings in urine: Secondary | ICD-10-CM | POA: Diagnosis not present

## 2020-03-23 DIAGNOSIS — R350 Frequency of micturition: Secondary | ICD-10-CM | POA: Diagnosis not present

## 2020-03-30 DIAGNOSIS — H2512 Age-related nuclear cataract, left eye: Secondary | ICD-10-CM | POA: Diagnosis not present

## 2020-03-30 DIAGNOSIS — Z9841 Cataract extraction status, right eye: Secondary | ICD-10-CM | POA: Diagnosis not present

## 2020-03-30 DIAGNOSIS — Z9889 Other specified postprocedural states: Secondary | ICD-10-CM | POA: Diagnosis not present

## 2020-03-30 DIAGNOSIS — Z961 Presence of intraocular lens: Secondary | ICD-10-CM | POA: Diagnosis not present

## 2020-04-21 DIAGNOSIS — Z961 Presence of intraocular lens: Secondary | ICD-10-CM | POA: Diagnosis not present

## 2020-04-21 DIAGNOSIS — H5201 Hypermetropia, right eye: Secondary | ICD-10-CM | POA: Diagnosis not present

## 2020-04-25 ENCOUNTER — Other Ambulatory Visit: Payer: Self-pay

## 2020-04-25 ENCOUNTER — Encounter: Payer: Self-pay | Admitting: Podiatry

## 2020-04-25 ENCOUNTER — Ambulatory Visit (INDEPENDENT_AMBULATORY_CARE_PROVIDER_SITE_OTHER): Payer: PPO | Admitting: Podiatry

## 2020-04-25 DIAGNOSIS — M722 Plantar fascial fibromatosis: Secondary | ICD-10-CM

## 2020-04-25 DIAGNOSIS — M778 Other enthesopathies, not elsewhere classified: Secondary | ICD-10-CM

## 2020-04-25 NOTE — Progress Notes (Signed)
She presents today states that the toes across the top are doing much better she states that she has some lateral pain still that she points to the fourth fifth tarsometatarsal joints bilaterally.  Objective: Vital signs are stable alert and oriented x3.  Pulses are palpable.  She has no pain on palpation of the dorsal aspect of the right foot and toes.  Though she does have pain on palpation of the fourth fifth tarsometatarsal joint bilaterally right greater than left.  Assessment: Capsulitis TMT J bilateral.  Plan: Injected overlying the fourth fifth tarsometatarsal joint 10 mg of Kenalog 5 mg Marcaine point of maximal tenderness bilateral foot.  Follow-up with her in 6 weeks.

## 2020-05-18 DIAGNOSIS — H532 Diplopia: Secondary | ICD-10-CM | POA: Diagnosis not present

## 2020-05-18 DIAGNOSIS — H2512 Age-related nuclear cataract, left eye: Secondary | ICD-10-CM | POA: Diagnosis not present

## 2020-05-18 DIAGNOSIS — H18603 Keratoconus, unspecified, bilateral: Secondary | ICD-10-CM | POA: Diagnosis not present

## 2020-05-18 DIAGNOSIS — Z961 Presence of intraocular lens: Secondary | ICD-10-CM | POA: Diagnosis not present

## 2020-05-19 DIAGNOSIS — H532 Diplopia: Secondary | ICD-10-CM | POA: Diagnosis not present

## 2020-06-07 ENCOUNTER — Ambulatory Visit: Payer: PPO | Admitting: Dermatology

## 2020-06-07 ENCOUNTER — Encounter: Payer: Self-pay | Admitting: Dermatology

## 2020-06-07 ENCOUNTER — Other Ambulatory Visit: Payer: Self-pay

## 2020-06-07 DIAGNOSIS — L820 Inflamed seborrheic keratosis: Secondary | ICD-10-CM

## 2020-06-07 DIAGNOSIS — L821 Other seborrheic keratosis: Secondary | ICD-10-CM | POA: Diagnosis not present

## 2020-06-07 DIAGNOSIS — L578 Other skin changes due to chronic exposure to nonionizing radiation: Secondary | ICD-10-CM | POA: Diagnosis not present

## 2020-06-07 DIAGNOSIS — L814 Other melanin hyperpigmentation: Secondary | ICD-10-CM

## 2020-06-07 DIAGNOSIS — D692 Other nonthrombocytopenic purpura: Secondary | ICD-10-CM

## 2020-06-07 NOTE — Progress Notes (Signed)
   Follow-Up Visit   Subjective  Katelyn Rivera is a 69 y.o. female who presents for the following: spots (arms, left lower leg). She noticed these while at the beach recently and they were pink. Some improvement now. Not itchy.   The following portions of the chart were reviewed this encounter and updated as appropriate:      Review of Systems:  No other skin or systemic complaints except as noted in HPI or Assessment and Plan.  Objective  Well appearing patient in no apparent distress; mood and affect are within normal limits.  A focused examination was performed including arms, legs. Relevant physical exam findings are noted in the Assessment and Plan.  Objective  Left Pretibia: Waxy tan macule with mild erythema    Assessment & Plan   Purpura - Violaceous macules and patches arms - Benign - Related to age, sun damage and/or use of blood thinners - Observe - Can use OTC arnica containing moisturizer such as Dermend Bruise Formula if desired - Call for worsening or other concerns  Lentigines - Scattered tan macules - Discussed due to sun exposure - Benign, observe - Call for any changes  Actinic Damage - diffuse scaly erythematous macules with underlying dyspigmentation - Recommend daily broad spectrum sunscreen SPF 30+ to sun-exposed areas, reapply every 2 hours as needed.  - Call for new or changing lesions.  Seborrheic Keratoses - Stuck-on, waxy, tan-brown papules and plaques  - Discussed benign etiology and prognosis. - Observe - Call for any changes   Inflamed seborrheic keratosis Left Pretibia  Improving, not bothersome to patient. discussed LN2 if flares.  Return if symptoms worsen or fail to improve.  IJamesetta Orleans, CMA, am acting as scribe for Brendolyn Patty, MD .  Documentation: I have reviewed the above documentation for accuracy and completeness, and I agree with the above.  Brendolyn Patty MD

## 2020-06-08 ENCOUNTER — Ambulatory Visit: Payer: PPO | Admitting: Podiatry

## 2020-07-15 ENCOUNTER — Other Ambulatory Visit: Payer: Self-pay | Admitting: Dermatology

## 2020-07-15 DIAGNOSIS — N951 Menopausal and female climacteric states: Secondary | ICD-10-CM

## 2020-07-15 DIAGNOSIS — Z01419 Encounter for gynecological examination (general) (routine) without abnormal findings: Secondary | ICD-10-CM

## 2020-07-26 ENCOUNTER — Other Ambulatory Visit: Payer: Self-pay | Admitting: Dermatology

## 2020-07-26 DIAGNOSIS — Z01419 Encounter for gynecological examination (general) (routine) without abnormal findings: Secondary | ICD-10-CM

## 2020-07-26 DIAGNOSIS — N951 Menopausal and female climacteric states: Secondary | ICD-10-CM

## 2020-07-28 ENCOUNTER — Other Ambulatory Visit: Payer: Self-pay

## 2020-07-28 ENCOUNTER — Other Ambulatory Visit: Payer: Self-pay | Admitting: Dermatology

## 2020-07-28 ENCOUNTER — Telehealth: Payer: Self-pay

## 2020-07-28 DIAGNOSIS — N951 Menopausal and female climacteric states: Secondary | ICD-10-CM

## 2020-07-28 DIAGNOSIS — Z01419 Encounter for gynecological examination (general) (routine) without abnormal findings: Secondary | ICD-10-CM

## 2020-07-28 DIAGNOSIS — Z20822 Contact with and (suspected) exposure to covid-19: Secondary | ICD-10-CM | POA: Diagnosis not present

## 2020-07-28 MED ORDER — ESTRADIOL 0.1 MG/GM VA CREA: 0.2500 | TOPICAL_CREAM | VAGINAL | 1 refills | Status: DC

## 2020-07-28 NOTE — Telephone Encounter (Signed)
Patient called regarding her Estradiol refill. She states it was prescribed by you before but I could not find this anywhere. However, I do see in her notes from you to continue using.   Please advise. OK to refill?

## 2020-07-28 NOTE — Telephone Encounter (Signed)
Refill sent per patients request via mychart

## 2020-08-01 NOTE — Telephone Encounter (Signed)
RX sent in and patient advised. 

## 2020-08-01 NOTE — Telephone Encounter (Signed)
I can send in a rf of the estradiol cream.  I did not prescribe it, so I don't know the sig. on it.  You can contact pharmacy and see how it is prescribed. Or her gynecologist can send in a rf.

## 2020-08-01 NOTE — Telephone Encounter (Signed)
Called patient back as per the notes last week [facility] sent in RFs for patient per her MyChart message. Patient was unaware but will call [facility] now.

## 2020-08-05 DIAGNOSIS — U071 COVID-19: Secondary | ICD-10-CM | POA: Diagnosis not present

## 2020-08-05 DIAGNOSIS — J019 Acute sinusitis, unspecified: Secondary | ICD-10-CM | POA: Diagnosis not present

## 2020-08-05 DIAGNOSIS — J209 Acute bronchitis, unspecified: Secondary | ICD-10-CM | POA: Diagnosis not present

## 2020-08-05 DIAGNOSIS — R05 Cough: Secondary | ICD-10-CM | POA: Diagnosis not present

## 2020-08-09 ENCOUNTER — Other Ambulatory Visit: Payer: Self-pay | Admitting: Dermatology

## 2020-08-09 ENCOUNTER — Telehealth: Payer: Self-pay

## 2020-08-09 ENCOUNTER — Telehealth: Payer: Self-pay | Admitting: Certified Nurse Midwife

## 2020-08-09 DIAGNOSIS — Z01419 Encounter for gynecological examination (general) (routine) without abnormal findings: Secondary | ICD-10-CM

## 2020-08-09 DIAGNOSIS — N951 Menopausal and female climacteric states: Secondary | ICD-10-CM

## 2020-08-09 NOTE — Telephone Encounter (Signed)
Melissa from the [facility] called in stating that the patient was told by a woman named Manuela Schwartz that a Rx for estradiol was called in however they have not received anything. Could you please advise?  Call back number is [phone]

## 2020-08-09 NOTE — Telephone Encounter (Signed)
This prescription was sent in on 07/28/20 and confirmation of receipt was received from pharmacy at 351 07/28/20. Confirmation faxed back to [facility] attention Manuela Schwartz. Faxed confirmation received.

## 2020-08-12 ENCOUNTER — Telehealth: Payer: Self-pay | Admitting: Obstetrics and Gynecology

## 2020-08-12 ENCOUNTER — Other Ambulatory Visit: Payer: Self-pay | Admitting: Surgical

## 2020-08-12 DIAGNOSIS — N951 Menopausal and female climacteric states: Secondary | ICD-10-CM

## 2020-08-12 DIAGNOSIS — Z01419 Encounter for gynecological examination (general) (routine) without abnormal findings: Secondary | ICD-10-CM

## 2020-08-12 MED ORDER — ESTRADIOL 0.1 MG/GM VA CREA: 0.2500 | TOPICAL_CREAM | VAGINAL | 3 refills | Status: DC

## 2020-08-12 NOTE — Telephone Encounter (Signed)
Spoke with pharmacy and they did not receive prescription at the pharmacy. I resent to pharmacy while on the phone and they received. I have notified patient that prescription has been resent. I apologized for the delay.

## 2020-08-12 NOTE — Telephone Encounter (Signed)
Patient called this morning stated she is having a hard time getting a refill. She has called the [facility] on [address] and state they have not received any refill authorizations from [place]. Can you please advise.Thank you .

## 2020-08-16 NOTE — Telephone Encounter (Signed)
Spoke with pharmacy and let them know they can take one set of the instructions off. It is the same directions on there twice. I have let the patient know that prescription has been fixed and should be ready at the pharmacy.

## 2020-08-16 NOTE — Telephone Encounter (Signed)
Patient called in stating that her pharmacy stated they could not refill her prescription for estradiol as it was sent in with two different directions. Patient states she's been trying for two weeks to get this Rx refilled. Could you please advise?

## 2020-09-21 ENCOUNTER — Encounter: Payer: Self-pay | Admitting: Dermatology

## 2020-09-21 ENCOUNTER — Other Ambulatory Visit: Payer: Self-pay

## 2020-09-21 ENCOUNTER — Ambulatory Visit: Payer: PPO | Admitting: Dermatology

## 2020-09-21 DIAGNOSIS — L821 Other seborrheic keratosis: Secondary | ICD-10-CM | POA: Diagnosis not present

## 2020-09-21 DIAGNOSIS — L814 Other melanin hyperpigmentation: Secondary | ICD-10-CM

## 2020-09-21 DIAGNOSIS — L820 Inflamed seborrheic keratosis: Secondary | ICD-10-CM | POA: Diagnosis not present

## 2020-09-21 NOTE — Patient Instructions (Addendum)
Cryotherapy Aftercare  . Wash gently with soap and water everyday.   Marland Kitchen Apply Vaseline and Band-Aid daily until healed.  Prior to procedure, discussed risks of blister formation, small wound, skin dyspigmentation, or rare scar following cryotherapy.  Seborrheic Keratosis  What causes seborrheic keratoses? Seborrheic keratoses are harmless, common skin growths that first appear during adult life.  As time goes by, more growths appear.  Some people may develop a large number of them.  Seborrheic keratoses appear on both covered and uncovered body parts.  They are not caused by sunlight.  The tendency to develop seborrheic keratoses can be inherited.  They vary in color from skin-colored to gray, brown, or even black.  They can be either smooth or have a rough, warty surface.   Seborrheic keratoses are superficial and look as if they were stuck on the skin.  Under the microscope this type of keratosis looks like layers upon layers of skin.  That is why at times the top layer may seem to fall off, but the rest of the growth remains and re-grows.    Treatment Seborrheic keratoses do not need to be treated, but can easily be removed in the office.  Seborrheic keratoses often cause symptoms when they rub on clothing or jewelry.  Lesions can be in the way of shaving.  If they become inflamed, they can cause itching, soreness, or burning.  Removal of a seborrheic keratosis can be accomplished by freezing, burning, or surgery. If any spot bleeds, scabs, or grows rapidly, please return to have it checked, as these can be an indication of a skin cancer.

## 2020-09-21 NOTE — Progress Notes (Signed)
   Follow-Up Visit   Subjective  Katelyn Rivera is a 69 y.o. female who presents for the following: lesions (Neck, hands, abdomen, left thigh. Patient c/o spots that are bothersome. Patient picks at. ).  She also has some dark spots she wants removed.  They are asymptomatic.    The following portions of the chart were reviewed this encounter and updated as appropriate:     Review of Systems: No other skin or systemic complaints except as noted in HPI or Assessment and Plan.  Objective  Well appearing patient in no apparent distress; mood and affect are within normal limits.  A focused examination was performed including face, neck, hands, abdomen, left leg. Relevant physical exam findings are noted in the Assessment and Plan.  Objective  Left Neck - Anterior x2, left medial shoulder x1, left lateral calf x3, spinal upper back x3, right flank x3, right breast x1, left medial upper thigh x1 (14): Erythematous keratotic or waxy stuck-on papule or plaque.   Objective  Right Forearm below elbow x 1, R hand dorsum, R upper chest x 2: Waxy tan macules, see photos  Assessment & Plan  Inflamed seborrheic keratosis (14) Left Neck - Anterior x2, left medial shoulder x1, left lateral calf x3, spinal upper back x3, right flank x3, right breast x1, left medial upper thigh x1  Destruction of lesion - Left Neck - Anterior x2, left medial shoulder x1, left lateral calf x3, spinal upper back x3, right flank x3, right breast x1, left medial upper thigh x1  Destruction method: cryotherapy   Informed consent: discussed and consent obtained   Lesion destroyed using liquid nitrogen: Yes   Region frozen until ice ball extended beyond lesion: Yes   Outcome: patient tolerated procedure well with no complications   Post-procedure details: wound care instructions given    Seborrheic keratosis Right Forearm below elbow x 1, R hand dorsum, R upper chest x 2  Prior to procedure, discussed risks of  blister formation, small wound, skin dyspigmentation, or rare scar following cryotherapy.   Discussed cosmetic procedure (cryotherapy), noncovered.  $60 for 1st lesion and $15 for each additional lesion if done on the same day.  Maximum charge $350.  One touch-up treatment included no charge. Discussed risks of treatment including dyspigmentation, small scar, and/or recurrence.  Cryotherapy x 4 today- $105 today cosmetic fee  Destruction of lesion - Right Forearm below elbow x 1, R hand dorsum, R upper chest x 2  Destruction method: cryotherapy   Informed consent: discussed and consent obtained   Lesion destroyed using liquid nitrogen: Yes   Region frozen until ice ball extended beyond lesion: Yes   Outcome: patient tolerated procedure well with no complications   Post-procedure details: wound care instructions given    Lentigines  - Scattered tan macules at neck and hands - Discussed due to sun exposure - Benign, observe - Call for any changes -Recommend daily broad spectrum sunscreen SPF 30+ to sun-exposed areas, reapply every 2 hours as needed. Call for new or changing lesions.  Seborrheic Keratoses  - Stuck-on, waxy, tan-brown papules and plaques at hands and neck - Discussed benign etiology and prognosis. - Observe - Call for any changes  Return in about 2 months (around 11/21/2020) for cosmetic SK recheck.   I, Emelia Salisbury, CMA, am acting as scribe for Brendolyn Patty, MD.  Documentation: I have reviewed the above documentation for accuracy and completeness, and I agree with the above.  Brendolyn Patty MD

## 2020-09-26 DIAGNOSIS — H18603 Keratoconus, unspecified, bilateral: Secondary | ICD-10-CM | POA: Diagnosis not present

## 2020-09-26 DIAGNOSIS — Z961 Presence of intraocular lens: Secondary | ICD-10-CM | POA: Diagnosis not present

## 2020-09-26 DIAGNOSIS — H532 Diplopia: Secondary | ICD-10-CM | POA: Diagnosis not present

## 2020-09-26 DIAGNOSIS — H2512 Age-related nuclear cataract, left eye: Secondary | ICD-10-CM | POA: Diagnosis not present

## 2020-10-05 ENCOUNTER — Ambulatory Visit (INDEPENDENT_AMBULATORY_CARE_PROVIDER_SITE_OTHER): Payer: PPO | Admitting: Dermatology

## 2020-10-05 ENCOUNTER — Other Ambulatory Visit: Payer: Self-pay

## 2020-10-05 DIAGNOSIS — L821 Other seborrheic keratosis: Secondary | ICD-10-CM

## 2020-10-05 DIAGNOSIS — L820 Inflamed seborrheic keratosis: Secondary | ICD-10-CM

## 2020-10-05 NOTE — Patient Instructions (Signed)
Cryotherapy Aftercare  . Wash gently with soap and water everyday.   . Apply Vaseline and Band-Aid daily until healed.  

## 2020-10-05 NOTE — Progress Notes (Signed)
   Follow-Up Visit   Subjective  Katelyn Rivera is a 69 y.o. female who presents for the following: Follow-up.  Patient is here to recheck cosmetic SKs that were treated two weeks ago on the right hand, right forearm below elbow, and right upper chest x 2. Areas are still healing. Patient was accidentally scheduled for 2 weeks instead of 2 months.  One spot came off, but some still there (neck)  The following portions of the chart were reviewed this encounter and updated as appropriate:      Review of Systems:  No other skin or systemic complaints except as noted in HPI or Assessment and Plan.  Objective  Well appearing patient in no apparent distress; mood and affect are within normal limits.  A focused examination was performed including chest, arms. Relevant physical exam findings are noted in the Assessment and Plan.  Objective  Left ant neck x 1: Residual erythematous keratotic or waxy stuck-on papule of the left ant neck.  Objective  Right hand dorsum, right upper chest, right forearm below elbow: Healing LN2 sites.   Assessment & Plan  Inflamed seborrheic keratosis Left ant neck x 1  Continue Vaseline and bandage until treated areas are healed.  Destruction of lesion - Left ant neck x 1  Destruction method: cryotherapy   Informed consent: discussed and consent obtained   Lesion destroyed using liquid nitrogen: Yes   Region frozen until ice ball extended beyond lesion: Yes   Outcome: patient tolerated procedure well with no complications   Post-procedure details: wound care instructions given    Seborrheic keratosis Right hand dorsum, right upper chest, right forearm below elbow  Treated last visit- healing Continue Vaseline and bandage until treated areas are healed. Recheck on follow-up.  Return in about 2 months (around 12/05/2020) for f/u cosmetic SKs.  IJamesetta Orleans, CMA, am acting as scribe for Brendolyn Patty, MD .  Documentation: I have reviewed the  above documentation for accuracy and completeness, and I agree with the above.  Brendolyn Patty MD

## 2020-12-13 ENCOUNTER — Other Ambulatory Visit: Payer: Self-pay | Admitting: Internal Medicine

## 2020-12-13 DIAGNOSIS — Z1231 Encounter for screening mammogram for malignant neoplasm of breast: Secondary | ICD-10-CM

## 2020-12-30 LAB — COLOGUARD: COLOGUARD: NEGATIVE

## 2021-01-02 ENCOUNTER — Other Ambulatory Visit: Payer: Self-pay | Admitting: Internal Medicine

## 2021-01-02 DIAGNOSIS — R109 Unspecified abdominal pain: Secondary | ICD-10-CM

## 2021-01-09 ENCOUNTER — Other Ambulatory Visit: Payer: Self-pay

## 2021-01-09 ENCOUNTER — Ambulatory Visit
Admission: RE | Admit: 2021-01-09 | Discharge: 2021-01-09 | Disposition: A | Discharge status: Home / Self Care | Payer: Medicare Other | Source: Ambulatory Visit | Attending: Internal Medicine | Admitting: Internal Medicine

## 2021-01-09 DIAGNOSIS — R109 Unspecified abdominal pain: Secondary | ICD-10-CM

## 2021-01-24 ENCOUNTER — Other Ambulatory Visit: Payer: Self-pay

## 2021-01-24 ENCOUNTER — Ambulatory Visit
Admission: RE | Admit: 2021-01-24 | Discharge: 2021-01-24 | Disposition: A | Discharge status: Home / Self Care | Payer: Medicare Other | Source: Ambulatory Visit | Attending: Internal Medicine | Admitting: Internal Medicine

## 2021-01-24 DIAGNOSIS — Z1231 Encounter for screening mammogram for malignant neoplasm of breast: Secondary | ICD-10-CM | POA: Insufficient documentation

## 2021-02-08 ENCOUNTER — Encounter: Payer: Self-pay | Admitting: Dermatology

## 2021-02-08 ENCOUNTER — Other Ambulatory Visit: Payer: Self-pay

## 2021-02-08 ENCOUNTER — Ambulatory Visit: Payer: Medicare Other | Admitting: Dermatology

## 2021-02-08 DIAGNOSIS — L820 Inflamed seborrheic keratosis: Secondary | ICD-10-CM | POA: Diagnosis not present

## 2021-02-08 DIAGNOSIS — L988 Other specified disorders of the skin and subcutaneous tissue: Secondary | ICD-10-CM | POA: Diagnosis not present

## 2021-02-08 DIAGNOSIS — L821 Other seborrheic keratosis: Secondary | ICD-10-CM

## 2021-02-08 NOTE — Progress Notes (Signed)
   Follow-Up Visit   Subjective  Katelyn Rivera is a 70 y.o. female who presents for the following: Seborrheic Keratosis (Here for LN2 treatment. Face, scalp, torso. Rubbed, irritated. ).   The following portions of the chart were reviewed this encounter and updated as appropriate:      Review of Systems: No other skin or systemic complaints except as noted in HPI or Assessment and Plan.  Objective  Well appearing patient in no apparent distress; mood and affect are within normal limits.  A focused examination was performed including face, neck, chest and back and face, scalp, legs, torso, hands. Relevant physical exam findings are noted in the Assessment and Plan.  Objective  Right temporal hairline x1, left zygoma x1, left pretibia x1, left lower thigh x1, left inframammary x1, intermammary chest x6 (11): Erythematous keratotic or waxy stuck-on papule   Assessment & Plan  Inflamed seborrheic keratosis (11) Right temporal hairline x1, left zygoma x1, left pretibia x1, left lower thigh x1, left inframammary x1, intermammary chest x6  Destruction of lesion - Right temporal hairline x1, left zygoma x1, left pretibia x1, left lower thigh x1, left inframammary x1, intermammary chest x6  Destruction method: cryotherapy   Informed consent: discussed and consent obtained   Lesion destroyed using liquid nitrogen: Yes   Region frozen until ice ball extended beyond lesion: Yes   Outcome: patient tolerated procedure well with no complications   Post-procedure details: wound care instructions given    Seborrheic Keratoses - Stuck-on, waxy, tan-brown papules and plaques  - Discussed benign etiology and prognosis. - Observe - Call for any changes  Facial/Neck Elastosis Discussed Alastin neck cream for skin tightening. Discussed Skin Tyte laser Tx. $250 per treatment for neck  Return in about 6 months (around 08/11/2021) for ISK recheck, UBSE.   I, Emelia Salisbury, CMA, am acting as scribe  for Brendolyn Patty, MD.  Documentation: I have reviewed the above documentation for accuracy and completeness, and I agree with the above.  Brendolyn Patty MD

## 2021-02-08 NOTE — Patient Instructions (Addendum)
Prior to procedure, discussed risks of blister formation, small wound, skin dyspigmentation, or rare scar following cryotherapy.   Wound Care Instructions  1. Cleanse wound gently with soap and water once a day then pat dry with clean gauze. Apply a thing coat of Petrolatum (petroleum jelly, "Vaseline") over the wound (unless you have an allergy to this). We recommend that you use a new, sterile tube of Vaseline. Do not pick or remove scabs. Do not remove the yellow or white "healing tissue" from the base of the wound.  2. Cover the wound with fresh, clean, nonstick gauze and secure with paper tape. You may use Band-Aids in place of gauze and tape if the would is small enough, but would recommend trimming much of the tape off as there is often too much. Sometimes Band-Aids can irritate the skin.  3. You should call the office for your biopsy report after 1 week if you have not already been contacted.  4. If you experience any problems, such as abnormal amounts of bleeding, swelling, significant bruising, significant pain, or evidence of infection, please call the office immediately.  5. FOR ADULT SURGERY PATIENTS: If you need something for pain relief you may take 1 extra strength Tylenol (acetaminophen) AND 2 Ibuprofen (200mg  each) together every 4 hours as needed for pain. (do not take these if you are allergic to them or if you have a reason you should not take them.) Typically, you may only need pain medication for 1 to 3 days.    If you have any questions or concerns for your doctor, please call our main line at [phone] and press option 4 to reach your doctor's medical assistant. If no one answers, please leave a voicemail as directed and we will return your call as soon as possible. Messages left after 4 pm will be answered the following business day.   You may also send [place] a message via [place]. We typically respond to MyChart messages within 1-2 business days.  For prescription  refills, please ask your pharmacy to contact our office. Our fax number is [phone].  If you have an urgent issue when the clinic is closed that cannot wait until the next business day, you can page your doctor at the number below.    Please note that while we do our best to be available for urgent issues outside of office hours, we are not available 24/7.   If you have an urgent issue and are unable to reach [place], you may choose to seek medical care at your doctor's office, retail clinic, urgent care center, or emergency room.  If you have a medical emergency, please immediately call 911 or go to the emergency department.  Pager Numbers  - Dr. Nehemiah Massed: [phone]  - Dr. Laurence Ferrari: [phone]  - Dr. Nicole Kindred: [phone]  In the event of inclement weather, please call our main line at [phone] for an update on the status of any delays or closures.  Dermatology Medication Tips: Please keep the boxes that topical medications come in in order to help keep track of the instructions about where and how to use these. Pharmacies typically print the medication instructions only on the boxes and not directly on the medication tubes.   If your medication is too expensive, please contact our office at [phone] option 4 or send [place] a message through [place].   We are unable to tell what your co-pay for medications will be in advance as this is different depending on your insurance  coverage. However, we may be able to find a substitute medication at lower cost or fill out paperwork to get insurance to cover a needed medication.   If a prior authorization is required to get your medication covered by your insurance company, please allow [place] 1-2 business days to complete this process.  Drug prices often vary depending on where the prescription is filled and some pharmacies may offer cheaper prices.  The website www.goodrx.com contains coupons for medications through different pharmacies. The  prices here do not account for what the cost may be with help from insurance (it may be cheaper with your insurance), but the website can give you the price if you did not use any insurance.  - You can print the associated coupon and take it with your prescription to the pharmacy.  - You may also stop by our office during regular business hours and pick up a GoodRx coupon card.  - If you need your prescription sent electronically to a different pharmacy, notify our office through [facility] or by phone at [phone] option 4.  Seborrheic Keratosis  What causes seborrheic keratoses? Seborrheic keratoses are harmless, common skin growths that first appear during adult life.  As time goes by, more growths appear.  Some people may develop a large number of them.  Seborrheic keratoses appear on both covered and uncovered body parts.  They are not caused by sunlight.  The tendency to develop seborrheic keratoses can be inherited.  They vary in color from skin-colored to gray, brown, or even black.  They can be either smooth or have a rough, warty surface.   Seborrheic keratoses are superficial and look as if they were stuck on the skin.  Under the microscope this type of keratosis looks like layers upon layers of skin.  That is why at times the top layer may seem to fall off, but the rest of the growth remains and re-grows.    Treatment Seborrheic keratoses do not need to be treated, but can easily be removed in the office.  Seborrheic keratoses often cause symptoms when they rub on clothing or jewelry.  Lesions can be in the way of shaving.  If they become inflamed, they can cause itching, soreness, or burning.  Removal of a seborrheic keratosis can be accomplished by freezing, burning, or surgery. If any spot bleeds, scabs, or grows rapidly, please return to have it checked, as these can be an indication of a skin cancer.

## 2021-08-01 ENCOUNTER — Other Ambulatory Visit: Payer: Self-pay

## 2021-08-01 ENCOUNTER — Ambulatory Visit: Payer: Medicare Other | Admitting: Dermatology

## 2021-08-01 DIAGNOSIS — L814 Other melanin hyperpigmentation: Secondary | ICD-10-CM

## 2021-08-01 DIAGNOSIS — L900 Lichen sclerosus et atrophicus: Secondary | ICD-10-CM | POA: Diagnosis not present

## 2021-08-01 DIAGNOSIS — D2271 Melanocytic nevi of right lower limb, including hip: Secondary | ICD-10-CM | POA: Diagnosis not present

## 2021-08-01 DIAGNOSIS — D229 Melanocytic nevi, unspecified: Secondary | ICD-10-CM

## 2021-08-01 DIAGNOSIS — D1800 Hemangioma unspecified site: Secondary | ICD-10-CM

## 2021-08-01 DIAGNOSIS — I8310 Varicose veins of unspecified lower extremity with inflammation: Secondary | ICD-10-CM | POA: Diagnosis not present

## 2021-08-01 DIAGNOSIS — L820 Inflamed seborrheic keratosis: Secondary | ICD-10-CM | POA: Diagnosis not present

## 2021-08-01 DIAGNOSIS — Z1283 Encounter for screening for malignant neoplasm of skin: Secondary | ICD-10-CM | POA: Diagnosis not present

## 2021-08-01 DIAGNOSIS — L821 Other seborrheic keratosis: Secondary | ICD-10-CM

## 2021-08-01 DIAGNOSIS — L578 Other skin changes due to chronic exposure to nonionizing radiation: Secondary | ICD-10-CM

## 2021-08-01 NOTE — Patient Instructions (Addendum)
Seborrheic Keratosis  What causes seborrheic keratoses? Seborrheic keratoses are harmless, common skin growths that first appear during adult life.  As time goes by, more growths appear.  Some people may develop a large number of them.  Seborrheic keratoses appear on both covered and uncovered body parts.  They are not caused by sunlight.  The tendency to develop seborrheic keratoses can be inherited.  They vary in color from skin-colored to gray, brown, or even black.  They can be either smooth or have a rough, warty surface.   Seborrheic keratoses are superficial and look as if they were stuck on the skin.  Under the microscope this type of keratosis looks like layers upon layers of skin.  That is why at times the top layer may seem to fall off, but the rest of the growth remains and re-grows.    Treatment Seborrheic keratoses do not need to be treated, but can easily be removed in the office.  Seborrheic keratoses often cause symptoms when they rub on clothing or jewelry.  Lesions can be in the way of shaving.  If they become inflamed, they can cause itching, soreness, or burning.  Removal of a seborrheic keratosis can be accomplished by freezing, burning, or surgery. If any spot bleeds, scabs, or grows rapidly, please return to have it checked, as these can be an indication of a skin cancer.  Cryotherapy Aftercare  Wash gently with soap and water everyday.   Apply Vaseline and Band-Aid daily until healed.    Melanoma ABCDEs  Melanoma is the most dangerous type of skin cancer, and is the leading cause of death from skin disease.  You are more likely to develop melanoma if you: Have light-colored skin, light-colored eyes, or red or blond hair Spend a lot of time in the sun Tan regularly, either outdoors or in a tanning bed Have had blistering sunburns, especially during childhood Have a close family member who has had a melanoma Have atypical moles or large birthmarks  Early detection of  melanoma is key since treatment is typically straightforward and cure rates are extremely high if we catch it early.   The first sign of melanoma is often a change in a mole or a new dark spot.  The ABCDE system is a way of remembering the signs of melanoma.  A for asymmetry:  The two halves do not match. B for border:  The edges of the growth are irregular. C for color:  A mixture of colors are present instead of an even brown color. D for diameter:  Melanomas are usually (but not always) greater than 15m - the size of a pencil eraser. E for evolution:  The spot keeps changing in size, shape, and color.  Please check your skin once per month between visits. You can use a small mirror in front and a large mirror behind you to keep an eye on the back side or your body.   If you see any new or changing lesions before your next follow-up, please call to schedule a visit.  Please continue daily skin protection including broad spectrum sunscreen SPF 30+ to sun-exposed areas, reapplying every 2 hours as needed when you're outdoors.   Staying in the shade or wearing long sleeves, sun glasses (UVA+UVB protection) and wide brim hats (4-inch brim around the entire circumference of the hat) are also recommended for sun protection.    If you have any questions or concerns for your doctor, please call our main line at  [phone] and press option 4 to reach your doctor's medical assistant. If no one answers, please leave a voicemail as directed and we will return your call as soon as possible. Messages left after 4 pm will be answered the following business day.   You may also send [place] a message via [place]. We typically respond to MyChart messages within 1-2 business days.  For prescription refills, please ask your pharmacy to contact our office. Our fax number is [phone].  If you have an urgent issue when the clinic is closed that cannot wait until the next business day, you can page your doctor at  the number below.    Please note that while we do our best to be available for urgent issues outside of office hours, we are not available 24/7.   If you have an urgent issue and are unable to reach [place], you may choose to seek medical care at your doctor's office, retail clinic, urgent care center, or emergency room.  If you have a medical emergency, please immediately call 911 or go to the emergency department.  Pager Numbers  - Dr. Nehemiah Massed: [phone]  - Dr. Laurence Ferrari: [phone]  - Dr. Nicole Kindred: [phone]  In the event of inclement weather, please call our main line at [phone] for an update on the status of any delays or closures.  Dermatology Medication Tips: Please keep the boxes that topical medications come in in order to help keep track of the instructions about where and how to use these. Pharmacies typically print the medication instructions only on the boxes and not directly on the medication tubes.   If your medication is too expensive, please contact our office at [phone] option 4 or send [place] a message through [place].   We are unable to tell what your co-pay for medications will be in advance as this is different depending on your insurance coverage. However, we may be able to find a substitute medication at lower cost or fill out paperwork to get insurance to cover a needed medication.   If a prior authorization is required to get your medication covered by your insurance company, please allow [place] 1-2 business days to complete this process.  Drug prices often vary depending on where the prescription is filled and some pharmacies may offer cheaper prices.  The website www.goodrx.com contains coupons for medications through different pharmacies. The prices here do not account for what the cost may be with help from insurance (it may be cheaper with your insurance), but the website can give you the price if you did not use any insurance.  - You can print the  associated coupon and take it with your prescription to the pharmacy.  - You may also stop by our office during regular business hours and pick up a GoodRx coupon card.  - If you need your prescription sent electronically to a different pharmacy, notify our office through [facility] or by phone at [phone] option 4.

## 2021-08-01 NOTE — Progress Notes (Signed)
Follow-Up Visit   Subjective  Katelyn Rivera is a 70 y.o. female who presents for the following: Follow-up (Patient here today for 6 month follow up on upper body and isk check. Patient states she feels a small spot at left cheek. She noticed a tiny spot at right wrist that she some times scratches. ).  Spots on face also get irritated.  She has a h/o LS&A that currently is not bothersome.  She has clobetasol ointment that she uses prn.  Patient here for upper body skin exam and skin cancer screening.   The following portions of the chart were reviewed this encounter and updated as appropriate:       Objective  Well appearing patient in no apparent distress; mood and affect are within normal limits.  A focused examination was performed including upper extremities, including the arms, hands, fingers, and fingernails and bilateral legs. Relevant physical exam findings are noted in the Assessment and Plan.  left forehead at hairline x 1, left lower cheek x 1, right wrist x 1 (3) Erythematous keratotic or waxy stuck-on papule or plaque.   inferior labia minora, perianal Patient states improved - not examined today   right posterior ankle 2.5 medium brown macule   spinal upper back 4 x 3 mm speckled brown macule   Assessment & Plan  Inflamed seborrheic keratosis left forehead at hairline x 1, left lower cheek x 1, right wrist x 1  Destruction of lesion - left forehead at hairline x 1, left lower cheek x 1, right wrist x 1  Destruction method: cryotherapy   Informed consent: discussed and consent obtained   Lesion destroyed using liquid nitrogen: Yes   Region frozen until ice ball extended beyond lesion: Yes   Outcome: patient tolerated procedure well with no complications   Post-procedure details: wound care instructions given   Additional details:  Prior to procedure, discussed risks of blister formation, small wound, skin dyspigmentation, or rare scar following cryotherapy.  Recommend Vaseline ointment to treated areas while healing.   Lichen sclerosus et atrophicus inferior labia minora, perianal  Stable, controlled without symptoms  Recommending following up with OBGYN for refills of estrogen cream to use as prescribed.    Cont clobetasol ointment to affected areas vaginal area 1 - 2 times day as need for flares.  May decrease to twice weekly if symptoms controlled and increase as needed.   Lichen sclerosus is a chronic inflammatory condition of unknown cause that frequently involves the vaginal area and less commonly extragenital skin, and is NOT sexually transmitted. It frequently causes symptoms of pain and burning.  It requires regular monitoring and treatment with topical steroids to minimize inflammation and to reduce risk of scarring. There is also a risk of cancer in the vaginal area which is very low if inflammation is well controlled. Regular checks of the area are recommended. Please call if you notice any new or changing spots within this area.   Nevus right posterior ankle  Benign-appearing.  Observation.  Call clinic for new or changing lesions.  Recommend daily use of broad spectrum spf 30+ sunscreen to sun-exposed areas.    Lentigines spinal upper back  Lentigo vs SK  Benign-appearing, observe.    Lentigines - Scattered tan macules - Due to sun exposure - Benign-appering, observe - Recommend daily broad spectrum sunscreen SPF 30+ to sun-exposed areas, reapply every 2 hours as needed. - Call for any changes  Seborrheic Keratoses - Stuck-on, waxy, tan-brown papules and/or plaques  -  Benign-appearing - Discussed benign etiology and prognosis. - Observe - Call for any changes  Melanocytic Nevi - Tan-brown and/or pink-flesh-colored symmetric macules and papules - Benign appearing on exam today - Observation - Call clinic for new or changing moles - Recommend daily use of broad spectrum spf 30+ sunscreen to sun-exposed areas.    Hemangiomas - Red papules - Discussed benign nature - Observe - Call for any changes  Varicose Veins/Spider Veins - Dilated blue, purple or red veins at the lower extremities - Reassured - Smaller vessels can be treated by sclerotherapy (a procedure to inject a medicine into the veins to make them disappear) if desired, but the treatment is not covered by insurance. Larger vessels may be covered if symptomatic and we would refer to vascular surgeon if treatment desired.  Actinic Damage - Chronic condition, secondary to cumulative UV/sun exposure - diffuse scaly erythematous macules with underlying dyspigmentation - Recommend daily broad spectrum sunscreen SPF 30+ to sun-exposed areas, reapply every 2 hours as needed.  - Staying in the shade or wearing long sleeves, sun glasses (UVA+UVB protection) and wide brim hats (4-inch brim around the entire circumference of the hat) are also recommended for sun protection.  - Call for new or changing lesions.  Skin cancer screening performed today.  Return in about 1 year (around 08/01/2022) for tbse. I, Ruthell Rummage, CMA, am acting as scribe for Brendolyn Patty, MD.  Documentation: I have reviewed the above documentation for accuracy and completeness, and I agree with the above.  Brendolyn Patty MD

## 2021-10-30 ENCOUNTER — Emergency Department
Admission: EM | Admit: 2021-10-30 | Discharge: 2021-10-31 | Disposition: A | Discharge status: Home / Self Care | Payer: Medicare Other | Attending: Emergency Medicine | Admitting: Emergency Medicine

## 2021-10-30 ENCOUNTER — Emergency Department: Payer: Medicare Other

## 2021-10-30 ENCOUNTER — Other Ambulatory Visit: Payer: Self-pay

## 2021-10-30 DIAGNOSIS — K219 Gastro-esophageal reflux disease without esophagitis: Secondary | ICD-10-CM | POA: Diagnosis not present

## 2021-10-30 DIAGNOSIS — Z7982 Long term (current) use of aspirin: Secondary | ICD-10-CM | POA: Insufficient documentation

## 2021-10-30 DIAGNOSIS — K5900 Constipation, unspecified: Secondary | ICD-10-CM | POA: Insufficient documentation

## 2021-10-30 DIAGNOSIS — I10 Essential (primary) hypertension: Secondary | ICD-10-CM | POA: Diagnosis not present

## 2021-10-30 DIAGNOSIS — R109 Unspecified abdominal pain: Secondary | ICD-10-CM | POA: Diagnosis present

## 2021-10-30 DIAGNOSIS — K649 Unspecified hemorrhoids: Secondary | ICD-10-CM | POA: Diagnosis not present

## 2021-10-30 DIAGNOSIS — R1084 Generalized abdominal pain: Secondary | ICD-10-CM

## 2021-10-30 LAB — URINALYSIS, ROUTINE W REFLEX MICROSCOPIC
Bacteria, UA: NONE SEEN
Bilirubin Urine: NEGATIVE
Glucose, UA: NEGATIVE mg/dL
Hgb urine dipstick: NEGATIVE
Ketones, ur: 20 mg/dL — AB
Nitrite: NEGATIVE
Protein, ur: NEGATIVE mg/dL
Specific Gravity, Urine: 1.013 (ref 1.005–1.030)
pH: 6 (ref 5.0–8.0)

## 2021-10-30 LAB — CBC
HCT: 39.6 % (ref 36.0–46.0)
Hemoglobin: 13.3 g/dL (ref 12.0–15.0)
MCH: 30.2 pg (ref 26.0–34.0)
MCHC: 33.6 g/dL (ref 30.0–36.0)
MCV: 90 fL (ref 80.0–100.0)
Platelets: 360 10*3/uL (ref 150–400)
RBC: 4.4 MIL/uL (ref 3.87–5.11)
RDW: 13.1 % (ref 11.5–15.5)
WBC: 12.5 10*3/uL — ABNORMAL HIGH (ref 4.0–10.5)
nRBC: 0 % (ref 0.0–0.2)

## 2021-10-30 LAB — COMPREHENSIVE METABOLIC PANEL
ALT: 19 U/L (ref 0–44)
AST: 23 U/L (ref 15–41)
Albumin: 4.1 g/dL (ref 3.5–5.0)
Alkaline Phosphatase: 56 U/L (ref 38–126)
Anion gap: 7 (ref 5–15)
BUN: 13 mg/dL (ref 8–23)
CO2: 24 mmol/L (ref 22–32)
Calcium: 9.3 mg/dL (ref 8.9–10.3)
Chloride: 103 mmol/L (ref 98–111)
Creatinine, Ser: 0.71 mg/dL (ref 0.44–1.00)
GFR, Estimated: >60 mL/min (ref >60)
Glucose, Bld: 120 mg/dL — ABNORMAL HIGH (ref 70–99)
Potassium: 3.9 mmol/L (ref 3.5–5.1)
Sodium: 134 mmol/L — ABNORMAL LOW (ref 135–145)
Total Bilirubin: 0.7 mg/dL (ref 0.3–1.2)
Total Protein: 7.3 g/dL (ref 6.5–8.1)

## 2021-10-30 LAB — LIPASE, BLOOD: Lipase: 21 U/L (ref 11–51)

## 2021-10-30 MED ORDER — MORPHINE SULFATE (PF) 4 MG/ML IV SOLN
4.0000 mg | Freq: Once | INTRAVENOUS | Status: AC
  Administered 2021-10-30: 4 mg via INTRAVENOUS
  Filled 2021-10-30: qty 1

## 2021-10-30 MED ORDER — ONDANSETRON HCL 4 MG/2ML IJ SOLN
4.0000 mg | Freq: Once | INTRAMUSCULAR | Status: AC
  Administered 2021-10-30: 4 mg via INTRAVENOUS
  Filled 2021-10-30: qty 2

## 2021-10-30 MED ORDER — IOHEXOL 300 MG/ML  SOLN
100.0000 mL | Freq: Once | INTRAMUSCULAR | Status: AC | PRN
  Administered 2021-10-30: 100 mL via INTRAVENOUS

## 2021-10-30 MED ORDER — HYDROCORTISONE (PERIANAL) 2.5 % EX CREA: 1.0000 "application " | TOPICAL_CREAM | Freq: Two times a day (BID) | CUTANEOUS | 0 refills | Status: AC

## 2021-10-30 MED ORDER — LACTATED RINGERS IV BOLUS
1000.0000 mL | Freq: Once | INTRAVENOUS | Status: AC
  Administered 2021-10-30: 1000 mL via INTRAVENOUS

## 2021-10-30 NOTE — ED Provider Notes (Signed)
[facility] Emergency Department Provider Note   ____________________________________________   Event Date/Time   First MD Initiated Contact with Patient 10/30/21 2127     (approximate)  I have reviewed the triage vital signs and the nursing notes.   HISTORY  Chief Complaint Constipation    HPI Katelyn Rivera is a 70 y.o. female with past medical history of hypertension and hyperlipidemia who presents to the ED complaining of constipation and abdominal pain.  Patient reports that she had a dental procedure 4 days ago and has not had a bowel movement since then.  Over that time, she has been dealing with increasing pain over her bilateral lower quadrants.  She describes the pain as constant and sharp, not exacerbated or alleviated by anything in particular.  She has not had any nausea or vomiting, denies any fevers or dysuria.  She does state that she has started to pass stool while in the waiting room but has significant pain in her rectum when doing so and reports a history of hemorrhoids.  She has not noticed any blood in her stool and does not take any blood thinners.  Current symptoms are reminiscent of when patient has dealt with diverticulitis in the past.        Past Medical History:  Diagnosis Date   Allergy    allergic rhinitis   Arthritis    Dermatitis    LS et A   Diverticulitis    Hx of   Dysplastic nevus 03/08/2015   Excised. Left mid medial buttock   Dysplastic nevus 06/23/2015   Left lower quadrant abdomen. Mild atypia. Limited margins free   GERD (gastroesophageal reflux disease)    HNP (herniated nucleus pulposus with myelopathy), thoracic    Ovarian cyst    Psoriasis    UTI (lower urinary tract infection)     Patient Active Problem List   Diagnosis Date Noted   Diverticulosis 08/22/2018   Hypertension 08/22/2018   Pure hypercholesterolemia 08/22/2018   Vaginal atrophy 02/06/2018   Psoriasis 02/06/2018   Arthritis  11/19/2017   History of removal of both ovaries 02/26/2017   Chronic back pain 12/21/2015   ALLERGIC RHINITIS 06/18/2007    Past Surgical History:  Procedure Laterality Date   COLONOSCOPY     EYE SURGERY Bilateral    LASIK EYE SURGERY   LUMBAR LAMINECTOMY/DECOMPRESSION MICRODISCECTOMY Left 12/21/2015   Procedure: Left lumbar four-fivelumbar laminotomy and microdiscectomy;  Surgeon: Jovita Gamma, MD;  Location: [place] [facility];  Service: Neurosurgery;  Laterality: Left;   OOPHORECTOMY Bilateral    REFRACTIVE SURGERY     TUBAL LIGATION      Prior to Admission medications   Medication Sig Start Date End Date Taking? Authorizing Provider  hydrocortisone (ANUSOL-HC) 2.5 % rectal cream Place 1 application rectally 2 (two) times daily. 10/30/21  Yes Blake Divine, MD  aspirin EC 81 MG tablet Take 81 mg by mouth daily.    [provider]  bifidobacterium infantis (ALIGN) capsule Take by mouth.    [provider]  calcipotriene (DOVONOX) 0.005 % cream APPLY TO AFFECTED AREAS ONCE TO TWICE DAILY FOR PSORIASIS 06/19/19   [provider]  clobetasol ointment (TEMOVATE) 3.53 % Apply 1 application topically 2 (two) times daily.    [provider]  cloNIDine (CATAPRES) 0.1 MG tablet Take 0.1 mg by mouth 2 (two) times daily. Patient not taking: No sig reported 03/11/20   [provider]  estradiol (ESTRACE) 0.1 MG/GM vaginal cream Place 0.25  Applicatorfuls vaginally 2 (two) times a week. 1/3 applicator vaginally 2 X per week 08/15/20   [name], MD  folic acid (FOLVITE) 902 MCG tablet Take 800 mcg by mouth daily.     [provider]  Glucosamine 500 MG CAPS Take 1 capsule by mouth daily.      [provider]  hypromellose (GENTEAL) 0.3 % GEL ophthalmic ointment  07/07/19   [provider]  meloxicam (MOBIC) 15 MG tablet Take 1 tablet (15 mg total) by mouth daily. 03/16/20   Hyatt, Max T, DPM  multivitamin-lutein  (OCUVITE-LUTEIN) CAPS capsule Take 1 capsule by mouth daily.    [provider]  vitamin B-12 (CYANOCOBALAMIN) 1000 MCG tablet Take 1,000 mcg by mouth daily.    [provider]    Allergies Tetracycline  Family History  Problem Relation Age of Onset   Diverticulitis Mother    Alzheimer's disease Mother    Cancer Father        brain tumor   Diverticulitis Sister    Diverticulitis Sister     Social History Social History   Tobacco Use   Smoking status: Never   Smokeless tobacco: Never  Vaping Use   Vaping Use: Never used  Substance Use Topics   Alcohol use: No   Drug use: No    Review of Systems  Constitutional: No fever/chills Eyes: No visual changes. ENT: No sore throat. Cardiovascular: Denies chest pain. Respiratory: Denies shortness of breath. Gastrointestinal: Positive for abdominal pain.  No nausea, no vomiting.  No diarrhea.  Positive for constipation. Genitourinary: Negative for dysuria. Musculoskeletal: Negative for back pain. Skin: Negative for rash. Neurological: Negative for headaches, focal weakness or numbness.  ____________________________________________   PHYSICAL EXAM:  VITAL SIGNS: ED Triage Vitals  Enc Vitals Group     BP 10/30/21 1303 (!) 156/82     Pulse Rate 10/30/21 1303 81     Resp 10/30/21 1303 18     Temp 10/30/21 1303 99.1 F (37.3 C)     Temp src --      SpO2 10/30/21 1303 99 %     Weight --      Height --      Head Circumference --      Peak Flow --      Pain Score 10/30/21 1300 9     Pain Loc --      Pain Edu? --      Excl. in [place]? --     Constitutional: Alert and oriented. Eyes: Conjunctivae are normal. Head: Atraumatic. Nose: No congestion/rhinnorhea. Mouth/Throat: Mucous membranes are moist. Neck: Normal ROM Cardiovascular: Normal rate, regular rhythm. Grossly normal heart sounds.  2+ radial pulses bilaterally. Respiratory: Normal respiratory effort.  No retractions. Lungs  CTAB. Gastrointestinal: Soft and diffusely tender to palpation, greatest in the left lower quadrant. No distention.  Rectal exam with inflamed and tender hemorrhoids, no fecal impaction noted. Genitourinary: deferred Musculoskeletal: No lower extremity tenderness nor edema. Neurologic:  Normal speech and language. No gross focal neurologic deficits are appreciated. Skin:  Skin is warm, dry and intact. No rash noted. Psychiatric: Mood and affect are normal. Speech and behavior are normal.  ____________________________________________   LABS (all labs ordered are listed, but only abnormal results are displayed)  Labs Reviewed  COMPREHENSIVE METABOLIC PANEL - Abnormal; Notable for the following components:      Result Value   Sodium 134 (*)    Glucose, Bld 120 (*)    All other components within  normal limits  CBC - Abnormal; Notable for the following components:   WBC 12.5 (*)    All other components within normal limits  URINALYSIS, ROUTINE W REFLEX MICROSCOPIC - Abnormal; Notable for the following components:   Color, Urine YELLOW (*)    APPearance HAZY (*)    Ketones, ur 20 (*)    Leukocytes,Ua TRACE (*)    All other components within normal limits  URINE CULTURE  LIPASE, BLOOD    PROCEDURES  Procedure(s) performed (including Critical Care):  Procedures   ____________________________________________   INITIAL IMPRESSION / ASSESSMENT AND PLAN / ED COURSE      70 year old female with past medical history of hypertension and hyperlipidemia who presents to the ED complaining of increasing abdominal pain and constipation over the past 3 days.  She is now passing stool and there is no evidence of fecal impaction on exam, she does have an inflamed hemorrhoid.  She is additionally tender to palpation diffusely in her abdomen, reports history of diverticulitis and greatest area of tenderness is in her left lower quadrant.  Labs are unremarkable, we will further assess with CT  scan and treat symptomatically with IV morphine and Zofran.  CT scan is unremarkable, no evidence of diverticulitis or other acute process.  Patient is feeling much better on reassessment, UA is borderline for infection but she denies any urinary symptoms.  We will send urine for culture but hold off on antibiotics.  She is appropriate for outpatient management and will be prescribed Anusol for hemorrhoids, was also counseled on over-the-counter constipation regimen.  She was counseled to follow-up with her PCP and to return to the ED for new worsening symptoms, patient agrees with plan.      ____________________________________________   FINAL CLINICAL IMPRESSION(S) / ED DIAGNOSES  Final diagnoses:  Generalized abdominal pain  Constipation, unspecified constipation type  Hemorrhoids, unspecified hemorrhoid type     ED Discharge Orders          Ordered    hydrocortisone (ANUSOL-HC) 2.5 % rectal cream  2 times daily        10/30/21 2347             Note:  This document was prepared using Dragon voice recognition software and may include unintentional dictation errors.    Blake Divine, MD 10/30/21 [phone]

## 2021-10-30 NOTE — ED Triage Notes (Addendum)
Pt comes with c/o constipation for last few days. Pt states recent dental procedure and since has had chills and the constipation. Pt states no relief with any medications.  Pt states hx of hemorrhoids. Pt states she feels like she can't pee this am. Pt states hx of diverticulitis

## 2021-10-30 NOTE — Discharge Instructions (Signed)

## 2021-11-01 LAB — URINE CULTURE: Culture: 40000 — AB

## 2021-12-27 ENCOUNTER — Other Ambulatory Visit: Payer: Self-pay

## 2021-12-27 ENCOUNTER — Ambulatory Visit: Payer: Medicare Other | Admitting: Dermatology

## 2021-12-27 ENCOUNTER — Encounter: Payer: Self-pay | Admitting: Dermatology

## 2021-12-27 DIAGNOSIS — L814 Other melanin hyperpigmentation: Secondary | ICD-10-CM | POA: Diagnosis not present

## 2021-12-27 DIAGNOSIS — L821 Other seborrheic keratosis: Secondary | ICD-10-CM

## 2021-12-27 DIAGNOSIS — L820 Inflamed seborrheic keratosis: Secondary | ICD-10-CM

## 2021-12-27 DIAGNOSIS — L578 Other skin changes due to chronic exposure to nonionizing radiation: Secondary | ICD-10-CM | POA: Diagnosis not present

## 2021-12-27 NOTE — Patient Instructions (Addendum)
Cryotherapy Aftercare  Wash gently with soap and water everyday.   Apply Vaseline and Band-Aid daily until healed.   Prior to procedure, discussed risks of blister formation, small wound, skin dyspigmentation, or rare scar following cryotherapy. Recommend Vaseline ointment to treated areas while healing.   If You Need Anything After Your Visit  If you have any questions or concerns for your doctor, please call our main line at [phone] and press option 4 to reach your doctor's medical assistant. If no one answers, please leave a voicemail as directed and we will return your call as soon as possible. Messages left after 4 pm will be answered the following business day.   You may also send [place] a message via [place]. We typically respond to MyChart messages within 1-2 business days.  For prescription refills, please ask your pharmacy to contact our office. Our fax number is [phone].  If you have an urgent issue when the clinic is closed that cannot wait until the next business day, you can page your doctor at the number below.    Please note that while we do our best to be available for urgent issues outside of office hours, we are not available 24/7.   If you have an urgent issue and are unable to reach [place], you may choose to seek medical care at your doctor's office, retail clinic, urgent care center, or emergency room.  If you have a medical emergency, please immediately call 911 or go to the emergency department.  Pager Numbers  - Dr. Nehemiah Massed: [phone]  - Dr. Laurence Ferrari: [phone]  - Dr. Nicole Kindred: [phone]  In the event of inclement weather, please call our main line at [phone] for an update on the status of any delays or closures.  Dermatology Medication Tips: Please keep the boxes that topical medications come in in order to help keep track of the instructions about where and how to use these. Pharmacies typically print the medication instructions only on the  boxes and not directly on the medication tubes.   If your medication is too expensive, please contact our office at [phone] option 4 or send [place] a message through [place].   We are unable to tell what your co-pay for medications will be in advance as this is different depending on your insurance coverage. However, we may be able to find a substitute medication at lower cost or fill out paperwork to get insurance to cover a needed medication.   If a prior authorization is required to get your medication covered by your insurance company, please allow [place] 1-2 business days to complete this process.  Drug prices often vary depending on where the prescription is filled and some pharmacies may offer cheaper prices.  The website www.goodrx.com contains coupons for medications through different pharmacies. The prices here do not account for what the cost may be with help from insurance (it may be cheaper with your insurance), but the website can give you the price if you did not use any insurance.  - You can print the associated coupon and take it with your prescription to the pharmacy.  - You may also stop by our office during regular business hours and pick up a GoodRx coupon card.  - If you need your prescription sent electronically to a different pharmacy, notify our office through [facility] or by phone at [phone] option 4.     Si Usted Necesita Algo Despus de Su Visita  Tambin puede enviarnos un mensaje a Lawerance Cruel  de MyChart. Por lo general respondemos a los mensajes de MyChart en el transcurso de 1 a 2 das hbiles.  Para renovar recetas, por favor pida a su farmacia que se ponga en contacto con nuestra oficina. Harland Dingwall de fax es [place] [phone].  Si tiene un asunto urgente cuando la clnica est cerrada y que no puede esperar hasta el siguiente da hbil, puede llamar/localizar a su doctor(a) al nmero que aparece a continuacin.   Por favor, tenga en cuenta que  aunque hacemos todo lo posible para estar disponibles para asuntos urgentes fuera del horario de [place], no estamos disponibles las 24 horas del da, los 7 das de la [place].   Si tiene un problema urgente y no puede comunicarse con nosotros, puede optar por buscar atencin mdica  en el consultorio de su doctor(a), en una clnica privada, en un centro de atencin urgente o en una sala de emergencias.  Si tiene Engineering geologist, por favor llame inmediatamente al 911 o vaya a la sala de emergencias.  Nmeros de bper  - Dr. Nehemiah Massed: [phone]  - Dra. Moye: [phone]  - Dra. Nicole Kindred: [phone]  En caso de inclemencias del [place], por favor llame a Johnsie Kindred principal al [phone] para una actualizacin sobre el [place] de cualquier retraso o cierre.  Consejos para la medicacin en dermatologa: Por favor, guarde las cajas en las que vienen los medicamentos de uso tpico para ayudarle a seguir las instrucciones sobre dnde y cmo usarlos. Las farmacias generalmente imprimen las instrucciones del medicamento slo en las cajas y no directamente en los tubos del [place].   Si su medicamento es muy caro, por favor, pngase en contacto con Zigmund Daniel llamando al [phone] y presione la opcin 4 o envenos un mensaje a travs de Pharmacist, community.   No podemos decirle cul ser su copago por los medicamentos por adelantado ya que esto es diferente dependiendo de la cobertura de su seguro. Sin embargo, es posible que podamos encontrar un medicamento sustituto a Electrical engineer un formulario para que el seguro cubra el medicamento que se considera necesario.   Si se requiere una autorizacin previa para que su compaa de seguros [place] su medicamento, por favor permtanos de 1 a 2 das hbiles para completar este proceso.  Los precios de los medicamentos varan con frecuencia dependiendo del Environmental consultant de dnde se surte la receta y alguna farmacias pueden ofrecer precios ms  baratos.  El sitio web www.goodrx.com tiene cupones para medicamentos de Airline pilot. Los precios aqu no tienen en cuenta lo que podra costar con la ayuda del seguro (puede ser ms barato con su seguro), pero el sitio web puede darle el precio si no utiliz Research scientist (physical sciences).  - Puede imprimir el cupn correspondiente y llevarlo con su receta a la farmacia.  - Tambin puede pasar por nuestra oficina durante el horario de atencin regular y Charity fundraiser una tarjeta de cupones de GoodRx.  - Si necesita que su receta se enve electrnicamente a una farmacia diferente, informe a nuestra oficina a travs de MyChart de [place] o por telfono llamando al [phone] y presione la opcin 4.

## 2021-12-27 NOTE — Progress Notes (Signed)
° °  Follow-Up Visit   Subjective  Katelyn Rivera is a 71 y.o. female who presents for the following: lesions (Face. Patient c/o pink scaly protruding areas. Check bra-line areas. C/O areas rubbed, irritated).  The patient has spots, moles and lesions to be evaluated, some may be new or changing and the patient has concerns that these could be cancer.   The following portions of the chart were reviewed this encounter and updated as appropriate:      Review of Systems: No other skin or systemic complaints except as noted in HPI or Assessment and Plan.   Objective  Well appearing patient in no apparent distress; mood and affect are within normal limits.  A focused examination was performed including face, neck, chest, back. Relevant physical exam findings are noted in the Assessment and Plan.  Right Lateral Infraocular x1, right inframammary x3, right upper flank at bra line x1, left forehead x3 (8) Erythematous keratotic or waxy stuck-on papule. Pink-tan waxy patch at right lateral infraocular   Assessment & Plan  Inflamed seborrheic keratosis (8) Right Lateral Infraocular x1, right inframammary x3, right upper flank at bra line x1, left forehead x3  Destruction of lesion - Right Lateral Infraocular x1, right inframammary x3, right upper flank at bra line x1, left forehead x3  Destruction method: cryotherapy   Informed consent: discussed and consent obtained   Lesion destroyed using liquid nitrogen: Yes   Region frozen until ice ball extended beyond lesion: Yes   Outcome: patient tolerated procedure well with no complications   Post-procedure details: wound care instructions given   Additional details:  Prior to procedure, discussed risks of blister formation, small wound, skin dyspigmentation, or rare scar following cryotherapy. Recommend Vaseline ointment to treated areas while healing.    Seborrheic Keratoses - Stuck-on, waxy, tan-brown papules and/or plaques  -  Benign-appearing - Discussed benign etiology and prognosis. - Observe - Call for any changes  Actinic Damage - chronic, secondary to cumulative UV radiation exposure/sun exposure over time - diffuse scaly erythematous macules with underlying dyspigmentation - Recommend daily broad spectrum sunscreen SPF 30+ to sun-exposed areas, reapply every 2 hours as needed.  - Recommend staying in the shade or wearing long sleeves, sun glasses (UVA+UVB protection) and wide brim hats (4-inch brim around the entire circumference of the hat). - Call for new or changing lesions.  Lentigines - Scattered tan macules - Due to sun exposure - Benign-appering, observe - Recommend daily broad spectrum sunscreen SPF 30+ to sun-exposed areas, reapply every 2 hours as needed. - Call for any changes   Return for TBSE As Scheduled.  I, Emelia Salisbury, CMA, am acting as scribe for Brendolyn Patty, MD.  Documentation: I have reviewed the above documentation for accuracy and completeness, and I agree with the above.  Brendolyn Patty MD

## 2022-01-17 ENCOUNTER — Other Ambulatory Visit: Payer: Self-pay | Admitting: Obstetrics and Gynecology

## 2022-01-17 DIAGNOSIS — N951 Menopausal and female climacteric states: Secondary | ICD-10-CM

## 2022-01-17 DIAGNOSIS — Z01419 Encounter for gynecological examination (general) (routine) without abnormal findings: Secondary | ICD-10-CM

## 2022-01-25 ENCOUNTER — Encounter: Payer: Self-pay | Admitting: Dermatology

## 2022-02-07 ENCOUNTER — Other Ambulatory Visit: Payer: Self-pay | Admitting: Internal Medicine

## 2022-02-07 DIAGNOSIS — Z1231 Encounter for screening mammogram for malignant neoplasm of breast: Secondary | ICD-10-CM

## 2022-03-16 ENCOUNTER — Ambulatory Visit
Admission: RE | Admit: 2022-03-16 | Discharge: 2022-03-16 | Disposition: A | Discharge status: Home / Self Care | Payer: Medicare Other | Source: Ambulatory Visit | Attending: Internal Medicine | Admitting: Internal Medicine

## 2022-03-16 DIAGNOSIS — Z1231 Encounter for screening mammogram for malignant neoplasm of breast: Secondary | ICD-10-CM | POA: Insufficient documentation

## 2022-08-06 ENCOUNTER — Ambulatory Visit: Payer: Medicare Other | Admitting: Dermatology

## 2022-09-04 ENCOUNTER — Other Ambulatory Visit (HOSPITAL_COMMUNITY): Payer: Self-pay | Admitting: Orthopedic Surgery

## 2022-09-04 ENCOUNTER — Other Ambulatory Visit: Payer: Self-pay | Admitting: Orthopedic Surgery

## 2022-09-04 DIAGNOSIS — M5416 Radiculopathy, lumbar region: Secondary | ICD-10-CM

## 2022-09-12 ENCOUNTER — Ambulatory Visit
Admission: RE | Admit: 2022-09-12 | Discharge: 2022-09-12 | Disposition: A | Discharge status: Home / Self Care | Payer: Medicare Other | Source: Ambulatory Visit | Attending: Orthopedic Surgery | Admitting: Orthopedic Surgery

## 2022-09-12 DIAGNOSIS — M5416 Radiculopathy, lumbar region: Secondary | ICD-10-CM | POA: Diagnosis present

## 2022-09-18 ENCOUNTER — Ambulatory Visit: Payer: Medicare Other | Admitting: Dermatology

## 2023-01-25 ENCOUNTER — Other Ambulatory Visit: Payer: Self-pay | Admitting: Neurological Surgery

## 2023-01-25 DIAGNOSIS — M5410 Radiculopathy, site unspecified: Secondary | ICD-10-CM

## 2023-02-04 ENCOUNTER — Ambulatory Visit
Admission: RE | Admit: 2023-02-04 | Discharge: 2023-02-04 | Disposition: A | Discharge status: Home / Self Care | Payer: Medicare Other | Source: Ambulatory Visit | Attending: Neurological Surgery | Admitting: Neurological Surgery

## 2023-02-04 DIAGNOSIS — M5410 Radiculopathy, site unspecified: Secondary | ICD-10-CM | POA: Insufficient documentation

## 2023-02-12 ENCOUNTER — Other Ambulatory Visit: Payer: Self-pay | Admitting: Internal Medicine

## 2023-02-12 DIAGNOSIS — Z1231 Encounter for screening mammogram for malignant neoplasm of breast: Secondary | ICD-10-CM

## 2023-03-14 ENCOUNTER — Ambulatory Visit: Payer: PPO | Admitting: Urology

## 2023-03-19 ENCOUNTER — Ambulatory Visit
Admission: RE | Admit: 2023-03-19 | Discharge: 2023-03-19 | Disposition: A | Discharge status: Home / Self Care | Payer: Medicare Other | Source: Ambulatory Visit | Attending: Internal Medicine | Admitting: Internal Medicine

## 2023-03-19 DIAGNOSIS — Z1231 Encounter for screening mammogram for malignant neoplasm of breast: Secondary | ICD-10-CM | POA: Diagnosis present

## 2023-03-20 ENCOUNTER — Telehealth: Payer: Medicare Other

## 2023-03-20 ENCOUNTER — Ambulatory Visit: Payer: Medicare Other | Admitting: Orthopedic Surgery

## 2023-03-20 DIAGNOSIS — M1711 Unilateral primary osteoarthritis, right knee: Secondary | ICD-10-CM | POA: Diagnosis not present

## 2023-03-20 NOTE — Telephone Encounter (Signed)
Auth needed for right knee gel  

## 2023-03-22 ENCOUNTER — Encounter: Payer: Self-pay | Admitting: Orthopedic Surgery

## 2023-03-22 NOTE — Progress Notes (Unsigned)
Office Visit Note   Patient: Katelyn Rivera           Date of Birth: 03-14-1951           MRN: [id number] Visit Date: 03/20/2023 Requested by: Marguarite Arbour, MD [address] [facility] [place],  [place] [address] PCP: Marguarite Arbour, MD  Subjective: Chief Complaint  Patient presents with   Right Knee - Pain    HPI: Katelyn Rivera is a 72 y.o. female who presents to the office reporting right knee pain of 1 year duration.  She denies any history of injury.  She describes sporadic but not constant pain.  Usually worse when she gets up after sitting.  Describes some popping weakness and giving way.  A knee brace helps and this brace is primarily a compression sleeve.  Does describe some popping with walking.  She did have an injection in the anterior aspect of her knee in February without relief.  She likes to walk about 1 to 2 miles a day 3-4 times a week.  She does have radiographs which are reviewed which shows significant lateral compartment arthritis in the right knee.  She also has an MRI scan which shows lumbar spondylosis causing moderate to prominent impingement at L2-3 and L3-4.  No prior right knee surgery..                ROS: All systems reviewed are negative as they relate to the chief complaint within the history of present illness.  Patient denies fevers or chills.  Assessment & Plan: Visit Diagnoses:  1. Arthritis of right knee     Plan: Impression is right knee arthritis with significant spurring in the lateral compartment.  Effusion is present today.  Aspiration and injection performed in the right knee through superior lateral approach and we will see her back in 8 weeks to try gel injection. This patient is diagnosed with osteoarthritis of the knee(s).    Radiographs show evidence of joint space narrowing, osteophytes, subchondral sclerosis and/or subchondral cysts.  This patient has knee pain which interferes with functional and activities of  daily living.    This patient has experienced inadequate response, adverse effects and/or intolerance with conservative treatments such as acetaminophen, NSAIDS, topical creams, physical therapy or regular exercise, knee bracing and/or weight loss.   This patient has experienced inadequate response or has a contraindication to intra articular steroid injections for at least 3 months.   This patient is not scheduled to have a total knee replacement within 6 months of starting treatment with viscosupplementation.   Follow-Up Instructions: No follow-ups on file.   Orders:  No orders of the defined types were placed in this encounter.  No orders of the defined types were placed in this encounter.     Procedures: Large Joint Inj: R knee on 03/20/2023 4:56 PM Indications: diagnostic evaluation, joint swelling and pain Details: 18 G 1.5 in needle, superolateral approach  Arthrogram: No  Medications: 5 mL lidocaine 1 %; 40 mg methylPREDNISolone acetate 40 MG/ML; 4 mL bupivacaine 0.25 % Outcome: tolerated well, no immediate complications Procedure, treatment alternatives, risks and benefits explained, specific risks discussed. Consent was given by the patient. Immediately prior to procedure a time out was called to verify the correct patient, procedure, equipment, support staff and site/side marked as required. Patient was prepped and draped in the usual sterile fashion.       Clinical Data: No additional findings.  Objective: Vital Signs:  There were no vitals taken for this visit.  Physical Exam:  Constitutional: Patient appears well-developed HEENT:  Head: Normocephalic Eyes:EOM are normal Neck: Normal range of motion Cardiovascular: Normal rate Pulmonary/chest: Effort normal Neurologic: Patient is alert Skin: Skin is warm Psychiatric: Patient has normal mood and affect  Ortho Exam: Ortho exam demonstrates slight valgus alignment right knee versus left.  Pedal pulses  palpable.  No groin pain with range of motion of either hip.  No nerve root tension signs.  Range of motion is 0-1 25.  Collateral and cruciate ligaments are stable.  Specialty Comments:  No specialty comments available.  Imaging: No results found.   PMFS History: Patient Active Problem List   Diagnosis Date Noted   Diverticulosis 08/22/2018   Hypertension 08/22/2018   Pure hypercholesterolemia 08/22/2018   Vaginal atrophy 02/06/2018   Psoriasis 02/06/2018   Arthritis 11/19/2017   History of removal of both ovaries 02/26/2017   Chronic back pain 12/21/2015   ALLERGIC RHINITIS 06/18/2007   Past Medical History:  Diagnosis Date   Allergy    allergic rhinitis   Arthritis    Dermatitis    LS et A   Diverticulitis    Hx of   Dysplastic nevus 03/08/2015   Excised. Left mid medial buttock   Dysplastic nevus 06/23/2015   Left lower quadrant abdomen. Mild atypia. Limited margins free   GERD (gastroesophageal reflux disease)    HNP (herniated nucleus pulposus with myelopathy), thoracic    Ovarian cyst    Psoriasis    UTI (lower urinary tract infection)     Family History  Problem Relation Age of Onset   Diverticulitis Mother    Alzheimer's disease Mother    Cancer Father        brain tumor   Diverticulitis Sister    Diverticulitis Sister     Past Surgical History:  Procedure Laterality Date   COLONOSCOPY     EYE SURGERY Bilateral    LASIK EYE SURGERY   LUMBAR LAMINECTOMY/DECOMPRESSION MICRODISCECTOMY Left 12/21/2015   Procedure: Left lumbar four-fivelumbar laminotomy and microdiscectomy;  Surgeon: Shirlean Kelly, MD;  Location: [facility];  Service: Neurosurgery;  Laterality: Left;   OOPHORECTOMY Bilateral    REFRACTIVE SURGERY     TUBAL LIGATION     Social History   Occupational History   Not on file  Tobacco Use   Smoking status: Never   Smokeless tobacco: Never  Vaping Use   Vaping Use: Never used  Substance and Sexual Activity   Alcohol use: No    Drug use: No   Sexual activity: Yes    Birth control/protection: None, Surgical

## 2023-03-22 NOTE — Telephone Encounter (Signed)
VOB submitted for Orthovisc, right knee   

## 2023-03-23 MED ORDER — BUPIVACAINE HCL 0.25 % IJ SOLN
4.0000 mL | INTRAMUSCULAR | Status: AC | PRN
  Administered 2023-03-20: 4 mL via INTRA_ARTICULAR

## 2023-03-23 MED ORDER — LIDOCAINE HCL 1 % IJ SOLN
5.0000 mL | INTRAMUSCULAR | Status: AC | PRN
  Administered 2023-03-20: 5 mL

## 2023-03-23 MED ORDER — METHYLPREDNISOLONE ACETATE 40 MG/ML IJ SUSP
40.0000 mg | INTRAMUSCULAR | Status: AC | PRN
  Administered 2023-03-20: 40 mg via INTRA_ARTICULAR

## 2023-04-30 ENCOUNTER — Other Ambulatory Visit: Payer: Self-pay

## 2023-04-30 DIAGNOSIS — M1711 Unilateral primary osteoarthritis, right knee: Secondary | ICD-10-CM

## 2023-05-20 ENCOUNTER — Ambulatory Visit: Payer: Medicare Other | Admitting: Orthopedic Surgery

## 2023-05-20 DIAGNOSIS — M1711 Unilateral primary osteoarthritis, right knee: Secondary | ICD-10-CM | POA: Diagnosis not present

## 2023-05-21 ENCOUNTER — Encounter: Payer: Self-pay | Admitting: Orthopedic Surgery

## 2023-05-21 ENCOUNTER — Ambulatory Visit: Payer: Medicare Other | Admitting: Dermatology

## 2023-05-21 VITALS — BP 180/80

## 2023-05-21 DIAGNOSIS — L821 Other seborrheic keratosis: Secondary | ICD-10-CM

## 2023-05-21 DIAGNOSIS — D692 Other nonthrombocytopenic purpura: Secondary | ICD-10-CM | POA: Diagnosis not present

## 2023-05-21 DIAGNOSIS — L578 Other skin changes due to chronic exposure to nonionizing radiation: Secondary | ICD-10-CM | POA: Diagnosis not present

## 2023-05-21 DIAGNOSIS — L814 Other melanin hyperpigmentation: Secondary | ICD-10-CM

## 2023-05-21 DIAGNOSIS — D229 Melanocytic nevi, unspecified: Secondary | ICD-10-CM

## 2023-05-21 DIAGNOSIS — Z86018 Personal history of other benign neoplasm: Secondary | ICD-10-CM

## 2023-05-21 DIAGNOSIS — Z1283 Encounter for screening for malignant neoplasm of skin: Secondary | ICD-10-CM

## 2023-05-21 MED ORDER — BUPIVACAINE HCL 0.25 % IJ SOLN
4.0000 mL | INTRAMUSCULAR | Status: AC | PRN
  Administered 2023-05-20: 4 mL via INTRA_ARTICULAR

## 2023-05-21 MED ORDER — LIDOCAINE HCL 1 % IJ SOLN
5.0000 mL | INTRAMUSCULAR | Status: AC | PRN
  Administered 2023-05-20: 5 mL

## 2023-05-21 MED ORDER — METHYLPREDNISOLONE ACETATE 40 MG/ML IJ SUSP
40.0000 mg | INTRAMUSCULAR | Status: AC | PRN
  Administered 2023-05-20: 40 mg via INTRA_ARTICULAR

## 2023-05-21 MED ORDER — HYALURONAN 30 MG/2ML IX SOSY
30.0000 mg | PREFILLED_SYRINGE | INTRA_ARTICULAR | Status: AC | PRN
  Administered 2023-05-20: 30 mg via INTRA_ARTICULAR

## 2023-05-21 NOTE — Patient Instructions (Signed)
Due to recent changes in healthcare laws, you may see results of your pathology and/or laboratory studies on MyChart before the doctors have had a chance to review them. We understand that in some cases there may be results that are confusing or concerning to you. Please understand that not all results are received at the same time and often the doctors may need to interpret multiple results in order to provide you with the best plan of care or course of treatment. Therefore, we ask that you please give us 2 business days to thoroughly review all your results before contacting the office for clarification. Should we see a critical lab result, you will be contacted sooner.   If You Need Anything After Your Visit  If you have any questions or concerns for your doctor, please call our main line at [phone] and press option 4 to reach your doctor's medical assistant. If no one answers, please leave a voicemail as directed and we will return your call as soon as possible. Messages left after 4 pm will be answered the following business day.   You may also send us a message via MyChart. We typically respond to MyChart messages within 1-2 business days.  For prescription refills, please ask your pharmacy to contact our office. Our fax number is [phone].  If you have an urgent issue when the clinic is closed that cannot wait until the next business day, you can page your doctor at the number below.    Please note that while we do our best to be available for urgent issues outside of office hours, we are not available 24/7.   If you have an urgent issue and are unable to reach us, you may choose to seek medical care at your doctor's office, retail clinic, urgent care center, or emergency room.  If you have a medical emergency, please immediately call 911 or go to the emergency department.  Pager Numbers  - Dr. Kowalski: [phone]  - Dr. Moye: [phone]  - Dr. Stewart:  [phone]  In the event of inclement weather, please call our main line at [phone] for an update on the status of any delays or closures.  Dermatology Medication Tips: Please keep the boxes that topical medications come in in order to help keep track of the instructions about where and how to use these. Pharmacies typically print the medication instructions only on the boxes and not directly on the medication tubes.   If your medication is too expensive, please contact our office at [phone] option 4 or send us a message through MyChart.   We are unable to tell what your co-pay for medications will be in advance as this is different depending on your insurance coverage. However, we may be able to find a substitute medication at lower cost or fill out paperwork to get insurance to cover a needed medication.   If a prior authorization is required to get your medication covered by your insurance company, please allow us 1-2 business days to complete this process.  Drug prices often vary depending on where the prescription is filled and some pharmacies may offer cheaper prices.  The website www.goodrx.com contains coupons for medications through different pharmacies. The prices here do not account for what the cost may be with help from insurance (it may be cheaper with your insurance), but the website can give you the price if you did not use any insurance.  - You can print the associated coupon and take it with   your prescription to the pharmacy.  - You may also stop by our office during regular business hours and pick up a GoodRx coupon card.  - If you need your prescription sent electronically to a different pharmacy, notify our office through [facility] MyChart or by phone at [phone] option 4.     Si Usted Necesita Algo Despus de Su Visita  Tambin puede enviarnos un mensaje a travs de MyChart. Por lo general respondemos a los mensajes de MyChart en el transcurso de 1 a 2  das hbiles.  Para renovar recetas, por favor pida a su farmacia que se ponga en contacto con nuestra oficina. Nuestro nmero de fax es el [phone].  Si tiene un asunto urgente cuando la clnica est cerrada y que no puede esperar hasta el siguiente da hbil, puede llamar/localizar a su doctor(a) al nmero que aparece a continuacin.   Por favor, tenga en cuenta que aunque hacemos todo lo posible para estar disponibles para asuntos urgentes fuera del horario de oficina, no estamos disponibles las 24 horas del da, los 7 das de la semana.   Si tiene un problema urgente y no puede comunicarse con nosotros, puede optar por buscar atencin mdica  en el consultorio de su doctor(a), en una clnica privada, en un centro de atencin urgente o en una sala de emergencias.  Si tiene una emergencia mdica, por favor llame inmediatamente al 911 o vaya a la sala de emergencias.  Nmeros de bper  - Dr. Kowalski: [phone]  - Dra. Moye: [phone]  - Dra. Stewart: [phone]  En caso de inclemencias del tiempo, por favor llame a nuestra lnea principal al [phone] para una actualizacin sobre el estado de cualquier retraso o cierre.  Consejos para la medicacin en dermatologa: Por favor, guarde las cajas en las que vienen los medicamentos de uso tpico para ayudarle a seguir las instrucciones sobre dnde y cmo usarlos. Las farmacias generalmente imprimen las instrucciones del medicamento slo en las cajas y no directamente en los tubos del medicamento.   Si su medicamento es muy caro, por favor, pngase en contacto con nuestra oficina llamando al [phone] y presione la opcin 4 o envenos un mensaje a travs de MyChart.   No podemos decirle cul ser su copago por los medicamentos por adelantado ya que esto es diferente dependiendo de la cobertura de su seguro. Sin embargo, es posible que podamos encontrar un medicamento sustituto a menor costo o llenar un formulario para que el  seguro cubra el medicamento que se considera necesario.   Si se requiere una autorizacin previa para que su compaa de seguros cubra su medicamento, por favor permtanos de 1 a 2 das hbiles para completar este proceso.  Los precios de los medicamentos varan con frecuencia dependiendo del lugar de dnde se surte la receta y alguna farmacias pueden ofrecer precios ms baratos.  El sitio web www.goodrx.com tiene cupones para medicamentos de diferentes farmacias. Los precios aqu no tienen en cuenta lo que podra costar con la ayuda del seguro (puede ser ms barato con su seguro), pero el sitio web puede darle el precio si no utiliz ningn seguro.  - Puede imprimir el cupn correspondiente y llevarlo con su receta a la farmacia.  - Tambin puede pasar por nuestra oficina durante el horario de atencin regular y recoger una tarjeta de cupones de GoodRx.  - Si necesita que su receta se enve electrnicamente a una farmacia diferente, informe a nuestra oficina a travs de MyChart de [place]   o por telfono llamando al [phone] y presione la opcin 4.  

## 2023-05-21 NOTE — Progress Notes (Signed)
   Procedure Note  Patient: Katelyn Rivera             Date of Birth: 08/16/1951           MRN: [id number]             Visit Date: 05/20/2023  Procedures: Visit Diagnoses:  1. Arthritis of right knee     Large Joint Inj on 05/20/2023 9:30 PM Indications: diagnostic evaluation, joint swelling and pain Details: 18 G 1.5 in needle, superolateral approach  Arthrogram: No  Medications: 5 mL lidocaine 1 %; 40 mg methylPREDNISolone acetate 40 MG/ML; 4 mL bupivacaine 0.25 %; 30 mg Hyaluronan 30 MG/2ML Outcome: tolerated well, no immediate complications Procedure, treatment alternatives, risks and benefits explained, specific risks discussed. Consent was given by the patient. Immediately prior to procedure a time out was called to verify the correct patient, procedure, equipment, support staff and site/side marked as required. Patient was prepped and draped in the usual sterile fashion.

## 2023-05-21 NOTE — Progress Notes (Signed)
   Follow-Up Visit   Subjective  Katelyn Rivera is a 72 y.o. female who presents for the following: Skin Cancer Screening and Full Body Skin Exam, check spots neck, no symptoms, hx of Dysplastic nevi  The patient presents for Total-Body Skin Exam (TBSE) for skin cancer screening and mole check. The patient has spots, moles and lesions to be evaluated, some may be new or changing and the patient has concerns that these could be cancer.    The following portions of the chart were reviewed this encounter and updated as appropriate: medications, allergies, medical history  Review of Systems:  No other skin or systemic complaints except as noted in HPI or Assessment and Plan.  Objective  Well appearing patient in no apparent distress; mood and affect are within normal limits.  A full examination was performed including scalp, head, eyes, ears, nose, lips, neck, chest, axillae, abdomen, back, buttocks, bilateral upper extremities, bilateral lower extremities, hands, feet, fingers, toes, fingernails, and toenails. All findings within normal limits unless otherwise noted below.   Relevant physical exam findings are noted in the Assessment and Plan.    Assessment & Plan   LENTIGINES, SEBORRHEIC KERATOSES, HEMANGIOMAS - Benign normal skin lesions - Benign-appearing - Call for any changes  MELANOCYTIC NEVI - Tan-brown and/or pink-flesh-colored symmetric macules and papules - Benign appearing on exam today - Observation - Call clinic for new or changing moles - Recommend daily use of broad spectrum spf 30+ sunscreen to sun-exposed areas.  MELANOCYTIC NEVI Exam:  - R post ankle 2.85mm med brown macule darker edge  Treatment Plan: Benign appearing on exam today. Recommend observation. Call clinic for new or changing moles. Recommend daily use of broad spectrum spf 30+ sunscreen to sun-exposed areas.    ACTINIC DAMAGE - Chronic condition, secondary to cumulative UV/sun exposure - diffuse  scaly erythematous macules with underlying dyspigmentation - Recommend daily broad spectrum sunscreen SPF 30+ to sun-exposed areas, reapply every 2 hours as needed.  - Staying in the shade or wearing long sleeves, sun glasses (UVA+UVB protection) and wide brim hats (4-inch brim around the entire circumference of the hat) are also recommended for sun protection.  - Call for new or changing lesions.  SKIN CANCER SCREENING PERFORMED TODAY.  HISTORY OF DYSPLASTIC NEVUS No evidence of recurrence today- L mid medial buttocks, LLQA Recommend regular full body skin exams Recommend daily broad spectrum sunscreen SPF 30+ to sun-exposed areas, reapply every 2 hours as needed.  Call if any new or changing lesions are noted between office visits    SEBORRHEIC KERATOSIS - Stuck-on, waxy, tan-brown papules and/or plaques - R lower neck - Benign-appearing - Discussed benign etiology and prognosis. - Observe - Call for any changes   Purpura - Chronic; persistent and recurrent.  Treatable, but not curable. - Violaceous macules and patches - Benign - Related to trauma, age, sun damage and/or use of blood thinners, chronic use of topical and/or oral steroids - Observe - Can use OTC arnica containing moisturizer such as Dermend Bruise Formula if desired - Call for worsening or other concerns   Return in about 1 year (around 05/20/2024) for TBSE, Hx of Dysplastic nevi.  I, Ardis Rowan, RMA, am acting as scribe for Willeen Niece, MD .   Documentation: I have reviewed the above documentation for accuracy and completeness, and I agree with the above.  Willeen Niece, MD

## 2023-05-27 ENCOUNTER — Ambulatory Visit: Payer: Medicare Other | Admitting: Surgical

## 2023-05-27 DIAGNOSIS — M1711 Unilateral primary osteoarthritis, right knee: Secondary | ICD-10-CM

## 2023-05-31 ENCOUNTER — Encounter: Payer: Self-pay | Admitting: Surgical

## 2023-05-31 MED ORDER — HYALURONAN 30 MG/2ML IX SOSY
30.0000 mg | PREFILLED_SYRINGE | INTRA_ARTICULAR | Status: AC | PRN
Start: 2023-05-27 — End: 2023-05-27
  Administered 2023-05-27: 30 mg via INTRA_ARTICULAR

## 2023-05-31 MED ORDER — LIDOCAINE HCL 1 % IJ SOLN
5.0000 mL | INTRAMUSCULAR | Status: AC | PRN
Start: 2023-05-27 — End: 2023-05-27
  Administered 2023-05-27: 5 mL

## 2023-05-31 NOTE — Progress Notes (Signed)
   Procedure Note  Patient: Katelyn Rivera             Date of Birth: 07-09-1951           MRN: [id number]             Visit Date: 05/27/2023  Procedures: Visit Diagnoses: No diagnosis found.  Large Joint Inj: R knee on 05/27/2023 10:37 AM Indications: diagnostic evaluation, joint swelling and pain Details: 18 G 1.5 in needle, superolateral approach  Arthrogram: No  Medications: 5 mL lidocaine 1 %; 30 mg Hyaluronan 30 MG/2ML Outcome: tolerated well, no immediate complications Procedure, treatment alternatives, risks and benefits explained, specific risks discussed. Consent was given by the patient. Immediately prior to procedure a time out was called to verify the correct patient, procedure, equipment, support staff and site/side marked as required. Patient was prepped and draped in the usual sterile fashion.

## 2023-06-10 ENCOUNTER — Ambulatory Visit: Payer: Medicare Other | Admitting: Surgical

## 2023-06-10 ENCOUNTER — Other Ambulatory Visit (INDEPENDENT_AMBULATORY_CARE_PROVIDER_SITE_OTHER): Payer: Medicare Other

## 2023-06-10 DIAGNOSIS — M1711 Unilateral primary osteoarthritis, right knee: Secondary | ICD-10-CM

## 2023-06-10 DIAGNOSIS — M1611 Unilateral primary osteoarthritis, right hip: Secondary | ICD-10-CM

## 2023-06-12 DIAGNOSIS — M1711 Unilateral primary osteoarthritis, right knee: Secondary | ICD-10-CM | POA: Diagnosis not present

## 2023-06-14 ENCOUNTER — Encounter: Payer: Self-pay | Admitting: Surgical

## 2023-06-14 MED ORDER — LIDOCAINE HCL 1 % IJ SOLN
5.0000 mL | INTRAMUSCULAR | Status: AC | PRN
Start: 2023-06-12 — End: 2023-06-12
  Administered 2023-06-12: 5 mL

## 2023-06-14 MED ORDER — HYALURONAN 88 MG/4ML IX SOSY
88.0000 mg | PREFILLED_SYRINGE | INTRA_ARTICULAR | Status: AC | PRN
Start: 2023-06-12 — End: 2023-06-12
  Administered 2023-06-12: 88 mg via INTRA_ARTICULAR

## 2023-06-14 NOTE — Progress Notes (Signed)
Office Visit Note   Patient: Katelyn Rivera           Date of Birth: 18-Jun-1951           MRN: [id number] Visit Date: 06/10/2023 Requested by: Marguarite Arbour, MD [address] [facility] [place],  [place] [address] PCP: Marguarite Arbour, MD  Subjective: Chief Complaint  Patient presents with  . Right Knee - Pain    HPI: Katelyn Rivera is a 72 y.o. female who presents to the office reporting right knee pain and right hip pain.  Patient describes continued right knee pain but the last injection of Orthovisc did give pretty good relief for her.  She is here today for the third injection in the series of 3 for Orthovisc.  However, she is also here describing "issues with her right hip".  She feels "something is not right".  She describes groin pain.  She has a lot of stiffness with her right hip relative to her left hip.  She has difficulty with getting her shoes on due to the hip stiffness.  She also has difficulty getting up and off of a boat because of her hip.  She does have history of prior lumbar spine surgery that was done by Dr. Newell Coral about 7 years ago.  She will occasionally have radicular pain from the back down to her foot.  No numbness or tingling..  No red flag signs or symptoms.  No such symptoms in the left leg.              ROS: All systems reviewed are negative as they relate to the chief complaint within the history of present illness.  Patient denies fevers or chills.  Assessment & Plan: Visit Diagnoses:  1. Arthritis of right knee     Plan: Patient is a 72 year old female who presents for evaluation of right knee pain and right hip pain.  She has stiffness in her right hip with difficulty with certain functions that involve having to AB duct or flex her hip such as getting onto her family boat.  She has radiographs taken today demonstrating moderate to severe right hip arthritis which correlates with the reproducible groin pain she has on exam today.   After discussion of options, patient wants to hold off on any further intervention.  Does not seem to be bothering her enough to consider surgical intervention and it would be worth trying hip joint injection in the future prior to considering surgery.  Patient will follow-up as needed regarding her hip.  The third Monovisc injection was administered today after aspirating 16 cc from her right knee.  Tolerated procedure well.  Follow-up as needed.  Follow-Up Instructions: No follow-ups on file.   Orders:  Orders Placed This Encounter  Procedures  . XR HIP UNILAT W OR W/O PELVIS 2-3 VIEWS RIGHT   No orders of the defined types were placed in this encounter.     Procedures: Large Joint Inj: R knee on 06/12/2023 10:27 AM Indications: pain, joint swelling and diagnostic evaluation Details: 18 G 1.5 in needle, superolateral approach  Arthrogram: No  Medications: 5 mL lidocaine 1 %; 88 mg Hyaluronan 88 MG/4ML Aspirate: 16 mL Outcome: tolerated well, no immediate complications Procedure, treatment alternatives, risks and benefits explained, specific risks discussed. Consent was given by the patient. Immediately prior to procedure a time out was called to verify the correct patient, procedure, equipment, support staff and site/side marked as required. Patient was  prepped and draped in the usual sterile fashion.     Clinical Data: No additional findings.  Objective: Vital Signs: There were no vitals taken for this visit.  Physical Exam:  Constitutional: Patient appears well-developed HEENT:  Head: Normocephalic Eyes:EOM are normal Neck: Normal range of motion Cardiovascular: Normal rate Pulmonary/chest: Effort normal Neurologic: Patient is alert Skin: Skin is warm Psychiatric: Patient has normal mood and affect  Ortho Exam: Ortho exam demonstrates right knee with small effusion.  No cellulitis or skin changes noted.  No change in range of motion of her right knee compared to last  exam.  She has positive FADIR sign of the right hip.  Positive Stinchfield sign of the right hip.  She has decreased internal rotation and external rotation passively of the right hip relative to the left.  She has intact hip flexion strength rated 5/5 but this does reproduce her pain.  She also has intact quadricep, hamstring, dorsiflexion, plantarflexion strength rated 5/5.  Specialty Comments:  No specialty comments available.  Imaging: No results found.   PMFS History: Patient Active Problem List   Diagnosis Date Noted  . Diverticulosis 08/22/2018  . Hypertension 08/22/2018  . Pure hypercholesterolemia 08/22/2018  . Vaginal atrophy 02/06/2018  . Psoriasis 02/06/2018  . Arthritis 11/19/2017  . History of removal of both ovaries 02/26/2017  . Chronic back pain 12/21/2015  . ALLERGIC RHINITIS 06/18/2007   Past Medical History:  Diagnosis Date  . Allergy    allergic rhinitis  . Arthritis   . Dermatitis    LS et A  . Diverticulitis    Hx of  . Dysplastic nevus 03/08/2015   Excised. Left mid medial buttock  . Dysplastic nevus 06/23/2015   Left lower quadrant abdomen. Mild atypia. Limited margins free  . GERD (gastroesophageal reflux disease)   . HNP (herniated nucleus pulposus with myelopathy), thoracic   . Ovarian cyst   . Psoriasis   . UTI (lower urinary tract infection)     Family History  Problem Relation Age of Onset  . Diverticulitis Mother   . Alzheimer's disease Mother   . Cancer Father        brain tumor  . Diverticulitis Sister   . Diverticulitis Sister     Past Surgical History:  Procedure Laterality Date  . COLONOSCOPY    . EYE SURGERY Bilateral    LASIK EYE SURGERY  . LUMBAR LAMINECTOMY/DECOMPRESSION MICRODISCECTOMY Left 12/21/2015   Procedure: Left lumbar four-fivelumbar laminotomy and microdiscectomy;  Surgeon: Shirlean Kelly, MD;  Location: [facility];  Service: Neurosurgery;  Laterality: Left;  . OOPHORECTOMY Bilateral   . REFRACTIVE SURGERY     . TUBAL LIGATION     Social History   Occupational History  . Not on file  Tobacco Use  . Smoking status: Never  . Smokeless tobacco: Never  Vaping Use  . Vaping status: Never Used  Substance and Sexual Activity  . Alcohol use: No  . Drug use: No  . Sexual activity: Yes    Birth control/protection: None, Surgical

## 2023-08-27 ENCOUNTER — Other Ambulatory Visit: Payer: Self-pay | Admitting: Internal Medicine

## 2023-08-27 DIAGNOSIS — R1032 Left lower quadrant pain: Secondary | ICD-10-CM

## 2023-08-30 ENCOUNTER — Ambulatory Visit
Admission: RE | Admit: 2023-08-30 | Discharge: 2023-08-30 | Disposition: A | Discharge status: Home / Self Care | Payer: Medicare Other | Source: Ambulatory Visit | Attending: Internal Medicine | Admitting: Internal Medicine

## 2023-08-30 DIAGNOSIS — R1032 Left lower quadrant pain: Secondary | ICD-10-CM

## 2023-09-17 NOTE — Addendum Note (Signed)
Encounter addended by: Roanna Epley on: 09/17/2023 3:46 PM  Actions taken: Imaging Exam ended

## 2024-01-15 ENCOUNTER — Ambulatory Visit: Payer: Medicare Other | Admitting: Dermatology

## 2024-03-11 ENCOUNTER — Other Ambulatory Visit: Payer: Self-pay | Admitting: Internal Medicine

## 2024-03-11 DIAGNOSIS — Z1231 Encounter for screening mammogram for malignant neoplasm of breast: Secondary | ICD-10-CM

## 2024-03-23 ENCOUNTER — Ambulatory Visit
Admission: RE | Admit: 2024-03-23 | Discharge: 2024-03-23 | Disposition: A | Discharge status: Home / Self Care | Source: Ambulatory Visit | Attending: Internal Medicine | Admitting: Internal Medicine

## 2024-03-23 DIAGNOSIS — Z1231 Encounter for screening mammogram for malignant neoplasm of breast: Secondary | ICD-10-CM | POA: Insufficient documentation

## 2024-03-24 LAB — EXTERNAL GENERIC LAB PROCEDURE: COLOGUARD: NEGATIVE

## 2024-03-24 LAB — COLOGUARD: COLOGUARD: NEGATIVE

## 2024-06-09 ENCOUNTER — Ambulatory Visit: Payer: Medicare Other | Admitting: Dermatology

## 2024-06-09 ENCOUNTER — Encounter: Payer: Self-pay | Admitting: Dermatology

## 2024-06-09 DIAGNOSIS — Z1283 Encounter for screening for malignant neoplasm of skin: Secondary | ICD-10-CM

## 2024-06-09 DIAGNOSIS — D2271 Melanocytic nevi of right lower limb, including hip: Secondary | ICD-10-CM

## 2024-06-09 DIAGNOSIS — Z86018 Personal history of other benign neoplasm: Secondary | ICD-10-CM

## 2024-06-09 DIAGNOSIS — W908XXA Exposure to other nonionizing radiation, initial encounter: Secondary | ICD-10-CM

## 2024-06-09 DIAGNOSIS — L578 Other skin changes due to chronic exposure to nonionizing radiation: Secondary | ICD-10-CM

## 2024-06-09 DIAGNOSIS — L814 Other melanin hyperpigmentation: Secondary | ICD-10-CM

## 2024-06-09 DIAGNOSIS — D1801 Hemangioma of skin and subcutaneous tissue: Secondary | ICD-10-CM

## 2024-06-09 DIAGNOSIS — L821 Other seborrheic keratosis: Secondary | ICD-10-CM | POA: Diagnosis not present

## 2024-06-09 DIAGNOSIS — L729 Follicular cyst of the skin and subcutaneous tissue, unspecified: Secondary | ICD-10-CM

## 2024-06-09 DIAGNOSIS — L720 Epidermal cyst: Secondary | ICD-10-CM

## 2024-06-09 DIAGNOSIS — L820 Inflamed seborrheic keratosis: Secondary | ICD-10-CM

## 2024-06-09 DIAGNOSIS — D692 Other nonthrombocytopenic purpura: Secondary | ICD-10-CM

## 2024-06-09 DIAGNOSIS — D229 Melanocytic nevi, unspecified: Secondary | ICD-10-CM

## 2024-06-09 NOTE — Progress Notes (Signed)
 Follow-Up Visit   Subjective  Katelyn Rivera is a 73 y.o. female who presents for the following: Skin Cancer Screening and Full Body Skin Exam,  hx of Dysplastic nevi.  Areas of concern on R neck, L cheek, R eye, L leg at calf, R arm at elbow, hands, R pubic area, R thigh. Some are irritating.  The patient presents for Total-Body Skin Exam (TBSE) for skin cancer screening and mole check. The patient has spots, moles and lesions to be evaluated, some may be new or changing and the patient has concerns that these could be cancer.    The following portions of the chart were reviewed this encounter and updated as appropriate: medications, allergies, medical history  Review of Systems:  No other skin or systemic complaints except as noted in HPI or Assessment and Plan.  Objective  Well appearing patient in no apparent distress; mood and affect are within normal limits.  A full examination was performed including scalp, head, eyes, ears, nose, lips, neck, chest, axillae, abdomen, back, buttocks, bilateral upper extremities, bilateral lower extremities, hands, feet, fingers, toes, fingernails, and toenails. All findings within normal limits unless otherwise noted below.   Relevant physical exam findings are noted in the Assessment and Plan.  Right Neck x1, R elbow x1, L calf x1, L cheek x1 (4) Erythematous keratotic or waxy stuck-on papule   Assessment & Plan   LENTIGINES, SEBORRHEIC KERATOSES, HEMANGIOMAS - Benign normal skin lesions - Benign-appearing - Call for any changes  MELANOCYTIC NEVI - Tan-brown and/or pink-flesh-colored symmetric macules and papules - R post ankle 2.52mm med brown macule darker edge. Stable from previous visit.  - Benign appearing on exam today - Observation - Call clinic for new or changing moles - Recommend daily use of broad spectrum spf 30+ sunscreen to sun-exposed areas.   ACTINIC DAMAGE - Chronic condition, secondary to cumulative UV/sun  exposure - diffuse scaly erythematous macules with underlying dyspigmentation - Recommend daily broad spectrum sunscreen SPF 30+ to sun-exposed areas, reapply every 2 hours as needed.  - Staying in the shade or wearing long sleeves, sun glasses (UVA+UVB protection) and wide brim hats (4-inch brim around the entire circumference of the hat) are also recommended for sun protection.  - Call for new or changing lesions.  SKIN CANCER SCREENING PERFORMED TODAY.  HISTORY OF DYSPLASTIC NEVUS No evidence of recurrence today- L mid medial buttocks, LLQA Recommend regular full body skin exams Recommend daily broad spectrum sunscreen SPF 30+ to sun-exposed areas, reapply every 2 hours as needed.  Call if any new or changing lesions are noted between office visits   SEBORRHEIC KERATOSIS - Stuck-on, waxy, tan-brown papules at right lateral eye/canthus, dorsal hands - Benign-appearing - Discussed benign etiology and prognosis. - Observe - Call for any changes  LENTIGINES Exam: scattered tan macules at dorsal hands Due to sun exposure Treatment Plan: Benign-appearing, observe. Recommend daily broad spectrum sunscreen SPF 30+ to sun-exposed areas, reapply every 2 hours as needed.  Call for any changes   INFLAMED SEBORRHEIC KERATOSIS (4) Right Neck x1, R elbow x1, L calf x1, L cheek x1 (4) Symptomatic, irritating, patient would like treated.   Recheck L calf at follow up. Call office if not resolved in 6-8 weeks.  Destruction of lesion - Right Neck x1, R elbow x1, L calf x1, L cheek x1 (4)  Destruction method: cryotherapy   Informed consent: discussed and consent obtained   Lesion destroyed using liquid nitrogen: Yes   Region frozen until  ice ball extended beyond lesion: Yes   Outcome: patient tolerated procedure well with no complications   Post-procedure details: wound care instructions given   Additional details:  Prior to procedure, discussed risks of blister formation, small wound, skin  dyspigmentation, or rare scar following cryotherapy. Recommend Vaseline ointment to treated areas while healing.     EPIDERMAL INCLUSION CYST Exam: Firm white papule at right vulva. 0.7 cm.  Benign-appearing. Exam most consistent with an epidermal inclusion cyst. Discussed that a cyst is a benign growth that can grow over time and sometimes get irritated or inflamed. Recommend observation if it is not bothersome. Discussed option of surgical excision to remove it if it is growing, symptomatic, or other changes noted. Please call for new or changing lesions so they can be evaluated.  HEMANGIOMA Exam: red papule at right anterior thigh Discussed benign nature. Recommend observation. Call for changes.   Purpura - Chronic; persistent and recurrent.  Treatable, but not curable. - Violaceous macules and patches - Benign - Related to trauma, age, sun damage and/or use of blood thinners, chronic use of topical and/or oral steroids - Observe - Can use OTC arnica containing moisturizer such as Dermend Bruise Formula if desired - Call for worsening or other concerns   Return in about 1 year (around 06/09/2025) for TBSE, HxDN.  I, Jill Parcell, CMA, am acting as scribe for Rexene Rattler, MD.    Documentation: I have reviewed the above documentation for accuracy and completeness, and I agree with the above.  Rexene Rattler, MD

## 2024-06-09 NOTE — Patient Instructions (Addendum)

## 2024-09-22 ENCOUNTER — Encounter: Payer: Self-pay | Admitting: Dermatology

## 2024-09-22 ENCOUNTER — Ambulatory Visit: Admitting: Dermatology

## 2024-09-22 DIAGNOSIS — L723 Sebaceous cyst: Secondary | ICD-10-CM

## 2024-09-22 DIAGNOSIS — L0291 Cutaneous abscess, unspecified: Secondary | ICD-10-CM

## 2024-09-22 DIAGNOSIS — L989 Disorder of the skin and subcutaneous tissue, unspecified: Secondary | ICD-10-CM

## 2024-09-22 DIAGNOSIS — N764 Abscess of vulva: Secondary | ICD-10-CM

## 2024-09-22 MED ORDER — DOXYCYCLINE MONOHYDRATE 100 MG PO CAPS: 100.0000 mg | ORAL_CAPSULE | Freq: Two times a day (BID) | ORAL | 0 refills | Status: AC

## 2024-09-22 MED ORDER — MUPIROCIN 2 % EX OINT: 1.0000 | TOPICAL_OINTMENT | Freq: Every day | CUTANEOUS | 1 refills | Status: AC

## 2024-09-22 NOTE — Progress Notes (Signed)
   Follow-Up Visit   Subjective  Katelyn Rivera is a 73 y.o. female who presents for the following: cyst at right vulva that has become painful, swollen, red, no drainage.    The following portions of the chart were reviewed this encounter and updated as appropriate: medications, allergies, medical history  Review of Systems:  No other skin or systemic complaints except as noted in HPI or Assessment and Plan.  Objective  Well appearing patient in no apparent distress; mood and affect are within normal limits.   A focused examination was performed of the following areas: groin  Relevant exam findings are noted in the Assessment and Plan.  right vulva Erythematous fluctuant nodule with white head  Assessment & Plan     ABSCESS right vulva Inflamed Cyst with Pain  Start doxycycline 100 mg bid with food x 2 weeks then if improving decrease to once daily if needed.  Doxycycline should be taken with food to prevent nausea. Do not lay down for 30 minutes after taking. Be cautious with sun exposure and use good sun protection while on this medication. Pregnant women should not take this medication.   Incision and Drainage - right vulva Location: Right vulva  Informed Consent: Discussed risks (permanent scarring, light or dark discoloration, infection, pain, bleeding, bruising, redness, damage to adjacent structures, and recurrence of the lesion) and benefits of the procedure, as well as the alternatives.  Informed consent was obtained.  Preparation: The area was prepped with alcohol.  Anesthesia: Lidocaine  1% with epinephrine   Procedure Details: An incision was made overlying the lesion. The lesion drained pus, white, chalky cyst material, and blood.  A small amount of fluid was drained.   Pieces of cyst wall were extracted with curettage and sharp dissection. Flushed with normal saline.   Antibiotic ointment and a sterile pressure dressing were applied. The patient tolerated  procedure well.  Total number of lesions drained: 1  Plan: The patient was instructed on post-op care. Recommend OTC analgesia as needed for pain.  PAINFUL SKIN LESION   INFLAMED EPIDERMOID CYST OF SKIN    Return for as scheduled, with Dr. Jackquline, TBSE and recheck cyst in 3 months.  [name] Lonell Drones, RMA, am acting as scribe for Rexene Jackquline, MD .   Documentation: I have reviewed the above documentation for accuracy and completeness, and I agree with the above.  Rexene Jackquline, MD

## 2024-09-22 NOTE — Patient Instructions (Signed)
 Start doxycycline 100 mg bid with food x 2 weeks then if improving decrease to once daily if needed.  Doxycycline should be taken with food to prevent nausea. Do not lay down for 30 minutes after taking. Be cautious with sun exposure and use good sun protection while on this medication. Pregnant women should not take this medication.   Due to recent changes in healthcare laws, you may see results of your pathology and/or laboratory studies on MyChart before the doctors have had a chance to review them. We understand that in some cases there may be results that are confusing or concerning to you. Please understand that not all results are received at the same time and often the doctors may need to interpret multiple results in order to provide you with the best plan of care or course of treatment. Therefore, we ask that you please give us  2 business days to thoroughly review all your results before contacting the office for clarification. Should we see a critical lab result, you will be contacted sooner.   If You Need Anything After Your Visit  If you have any questions or concerns for your doctor, please call our main line at [phone] and press option 4 to reach your doctor's medical assistant. If no one answers, please leave a voicemail as directed and we will return your call as soon as possible. Messages left after 4 pm will be answered the following business day.   You may also send us  a message via MyChart. We typically respond to MyChart messages within 1-2 business days.  For prescription refills, please ask your pharmacy to contact our office. Our fax number is [phone].  If you have an urgent issue when the clinic is closed that cannot wait until the next business day, you can page your doctor at the number below.    Please note that while we do our best to be available for urgent issues outside of office hours, we are not available 24/7.   If you have an urgent issue and are  unable to reach us , you may choose to seek medical care at your doctor's office, retail clinic, urgent care center, or emergency room.  If you have a medical emergency, please immediately call 911 or go to the emergency department.  Pager Numbers  - Dr. Hester: [phone]  - Dr. Jackquline: [phone]  - Dr. Claudene: [phone]   - Dr. Raymund: [phone]  In the event of inclement weather, please call our main line at [phone] for an update on the status of any delays or closures.  Dermatology Medication Tips: Please keep the boxes that topical medications come in in order to help keep track of the instructions about where and how to use these. Pharmacies typically print the medication instructions only on the boxes and not directly on the medication tubes.   If your medication is too expensive, please contact our office at [phone] option 4 or send us  a message through MyChart.   We are unable to tell what your co-pay for medications will be in advance as this is different depending on your insurance coverage. However, we may be able to find a substitute medication at lower cost or fill out paperwork to get insurance to cover a needed medication.   If a prior authorization is required to get your medication covered by your insurance company, please allow us  1-2 business days to complete this process.  Drug prices often vary depending on where the prescription is filled and  some pharmacies may offer cheaper prices.  The website www.goodrx.com contains coupons for medications through different pharmacies. The prices here do not account for what the cost may be with help from insurance (it may be cheaper with your insurance), but the website can give you the price if you did not use any insurance.  - You can print the associated coupon and take it with your prescription to the pharmacy.  - You may also stop by our office during regular business hours and pick up a GoodRx coupon  card.  - If you need your prescription sent electronically to a different pharmacy, notify our office through [facility] or by phone at [phone] option 4.     Si Usted Necesita Algo Despus de Su Visita  Tambin puede enviarnos un mensaje a travs de Clinical Cytogeneticist. Por lo general respondemos a los mensajes de MyChart en el transcurso de 1 a 2 das hbiles.  Para renovar recetas, por favor pida a su farmacia que se ponga en contacto con nuestra oficina. Randi lakes de fax es [place] [phone].  Si tiene un asunto urgente cuando la clnica est cerrada y que no puede esperar hasta el siguiente da hbil, puede llamar/localizar a su doctor(a) al nmero que aparece a continuacin.   Por favor, tenga en cuenta que aunque hacemos todo lo posible para estar disponibles para asuntos urgentes fuera del horario de [place], no estamos disponibles las 24 horas del da, los 7 [address]  [phone] de la [place].   Si tiene un problema urgente y no puede comunicarse con nosotros, puede optar por buscar atencin mdica  en el consultorio de su doctor(a), en una clnica privada, en un centro de atencin urgente o en una sala de emergencias.  Si tiene engineer, drilling, por favor llame inmediatamente al 911 o vaya a la sala de emergencias.  Nmeros de bper  - Dr. Hester: [phone]  - Dra. Jackquline: [phone]  - Dr. Claudene: [phone]  - Dra. Kitts: [phone]  En caso de inclemencias del [place], por favor llame a nuestra lnea principal al [phone] para una actualizacin sobre el estado de cualquier retraso o cierre.  Consejos para la medicacin en dermatologa: Por favor, guarde las cajas en las que vienen los medicamentos de uso tpico para ayudarle a seguir las instrucciones sobre dnde y cmo usarlos. Las farmacias generalmente imprimen las instrucciones del medicamento slo en las cajas y no directamente en los tubos del [place].   Si su medicamento es muy caro, por favor, pngase  en contacto con landry rieger llamando al [phone] y presione la opcin 4 o envenos un mensaje a travs de Clinical Cytogeneticist.   No podemos decirle cul ser su copago por los medicamentos por adelantado ya que esto es diferente dependiendo de la cobertura de su seguro. Sin embargo, es posible que podamos encontrar un medicamento sustituto a audiological scientist un formulario para que el seguro cubra el medicamento que se considera necesario.   Si se requiere una autorizacin previa para que su compaa de seguros cubra su medicamento, por favor permtanos de 1 a 2 das hbiles para completar este proceso.  Los precios de los medicamentos varan con frecuencia dependiendo del environmental consultant de dnde se surte la receta y alguna farmacias pueden ofrecer precios ms baratos.  El sitio web www.goodrx.com tiene cupones para medicamentos de health and safety inspector. Los precios aqu no tienen en cuenta lo que podra costar con la ayuda del seguro (puede ser ms barato con su seguro), pero el sitio  web puede darle el precio si no visual merchandiser.  - Puede imprimir el cupn correspondiente y llevarlo con su receta a la farmacia.  - Tambin puede pasar por nuestra oficina durante el horario de atencin regular y education officer, museum una tarjeta de cupones de GoodRx.  - Si necesita que su receta se enve electrnicamente a una farmacia diferente, informe a nuestra oficina a travs de MyChart de [place] o por telfono llamando al [phone] y presione la opcin 4.

## 2024-09-28 ENCOUNTER — Other Ambulatory Visit: Payer: Self-pay | Admitting: Internal Medicine

## 2024-09-28 DIAGNOSIS — N281 Cyst of kidney, acquired: Secondary | ICD-10-CM

## 2024-10-05 ENCOUNTER — Ambulatory Visit

## 2024-10-06 ENCOUNTER — Ambulatory Visit
Admission: RE | Admit: 2024-10-06 | Discharge: 2024-10-06 | Disposition: A | Discharge status: Home / Self Care | Source: Ambulatory Visit | Attending: Internal Medicine | Admitting: Internal Medicine

## 2024-10-06 DIAGNOSIS — N281 Cyst of kidney, acquired: Secondary | ICD-10-CM | POA: Insufficient documentation

## 2024-12-22 ENCOUNTER — Ambulatory Visit: Admitting: Dermatology

## 2025-06-22 ENCOUNTER — Encounter: Admitting: Dermatology
